# Patient Record
Sex: Female | Born: 1970 | Race: White | Hispanic: No | Marital: Single | Smoking: Former smoker
Health system: Southern US, Community
[De-identification: ages and names within clinical notes are randomized; demographics above are authoritative.]

## PROBLEM LIST (undated history)

## (undated) DIAGNOSIS — E039 Hypothyroidism, unspecified: Secondary | ICD-10-CM

## (undated) DIAGNOSIS — E2740 Unspecified adrenocortical insufficiency: Secondary | ICD-10-CM

## (undated) DIAGNOSIS — E119 Type 2 diabetes mellitus without complications: Secondary | ICD-10-CM

## (undated) DIAGNOSIS — E079 Disorder of thyroid, unspecified: Secondary | ICD-10-CM

---

## 1999-01-11 ENCOUNTER — Ambulatory Visit (HOSPITAL_BASED_OUTPATIENT_CLINIC_OR_DEPARTMENT_OTHER): Admission: RE | Admit: 1999-01-11 | Discharge: 1999-01-11 | Payer: Self-pay | Admitting: Specialist

## 2010-03-31 ENCOUNTER — Encounter: Admission: RE | Admit: 2010-03-31 | Discharge: 2010-03-31 | Payer: Self-pay | Admitting: Emergency Medicine

## 2010-04-21 ENCOUNTER — Encounter: Admission: RE | Admit: 2010-04-21 | Discharge: 2010-04-21 | Payer: Self-pay | Admitting: General Surgery

## 2010-04-21 ENCOUNTER — Other Ambulatory Visit: Admission: RE | Admit: 2010-04-21 | Discharge: 2010-04-21 | Payer: Self-pay | Admitting: Interventional Radiology

## 2010-06-24 ENCOUNTER — Ambulatory Visit (HOSPITAL_COMMUNITY): Admission: RE | Admit: 2010-06-24 | Discharge: 2010-06-25 | Payer: Self-pay | Admitting: Surgery

## 2010-06-24 ENCOUNTER — Encounter (INDEPENDENT_AMBULATORY_CARE_PROVIDER_SITE_OTHER): Payer: Self-pay | Admitting: Surgery

## 2010-10-17 ENCOUNTER — Encounter: Payer: Self-pay | Admitting: General Surgery

## 2010-12-09 LAB — BASIC METABOLIC PANEL
Calcium: 9.2 mg/dL (ref 8.4–10.5)
GFR calc Af Amer: >60 mL/min (ref >60)
Potassium: 4.2 mEq/L (ref 3.5–5.1)

## 2010-12-09 LAB — SURGICAL PCR SCREEN: MRSA, PCR: NEGATIVE

## 2010-12-09 LAB — DIFFERENTIAL
Basophils Relative: 0 % (ref 0–1)
Eosinophils Absolute: 0 10*3/uL (ref 0.0–0.7)
Lymphocytes Relative: 20 % (ref 12–46)
Monocytes Absolute: 0.5 10*3/uL (ref 0.1–1.0)
Monocytes Relative: 5 % (ref 3–12)
Neutro Abs: 6.8 10*3/uL (ref 1.7–7.7)
Neutrophils Relative %: 75 % (ref 43–77)

## 2010-12-09 LAB — URINALYSIS, ROUTINE W REFLEX MICROSCOPIC
Glucose, UA: NEGATIVE mg/dL
Hgb urine dipstick: NEGATIVE
Ketones, ur: NEGATIVE mg/dL
Urobilinogen, UA: 1 mg/dL (ref 0.0–1.0)
pH: 6 (ref 5.0–8.0)

## 2010-12-09 LAB — PREGNANCY, URINE: Preg Test, Ur: NEGATIVE

## 2010-12-09 LAB — PROTIME-INR
INR: 0.92 (ref 0.00–1.49)
Prothrombin Time: 12.6 seconds (ref 11.6–15.2)

## 2010-12-09 LAB — CBC: RBC: 4.34 MIL/uL (ref 3.87–5.11)

## 2012-01-06 ENCOUNTER — Other Ambulatory Visit: Payer: Self-pay | Admitting: Family Medicine

## 2012-01-06 DIAGNOSIS — N63 Unspecified lump in unspecified breast: Secondary | ICD-10-CM

## 2012-01-13 ENCOUNTER — Ambulatory Visit
Admission: RE | Admit: 2012-01-13 | Discharge: 2012-01-13 | Disposition: A | Discharge status: Home / Self Care | Payer: BC Managed Care – PPO | Source: Ambulatory Visit | Attending: Family Medicine | Admitting: Family Medicine

## 2012-01-13 DIAGNOSIS — N63 Unspecified lump in unspecified breast: Secondary | ICD-10-CM

## 2013-10-12 ENCOUNTER — Emergency Department (HOSPITAL_COMMUNITY)
Admission: EM | Admit: 2013-10-12 | Discharge: 2013-10-12 | Disposition: A | Discharge status: Home / Self Care | Payer: BC Managed Care – PPO | Attending: Emergency Medicine | Admitting: Emergency Medicine

## 2013-10-12 ENCOUNTER — Encounter (HOSPITAL_COMMUNITY): Payer: Self-pay | Admitting: Emergency Medicine

## 2013-10-12 ENCOUNTER — Emergency Department (HOSPITAL_COMMUNITY): Payer: BC Managed Care – PPO

## 2013-10-12 ENCOUNTER — Other Ambulatory Visit: Payer: Self-pay

## 2013-10-12 DIAGNOSIS — Z862 Personal history of diseases of the blood and blood-forming organs and certain disorders involving the immune mechanism: Secondary | ICD-10-CM | POA: Insufficient documentation

## 2013-10-12 DIAGNOSIS — Z880 Allergy status to penicillin: Secondary | ICD-10-CM | POA: Insufficient documentation

## 2013-10-12 DIAGNOSIS — R0602 Shortness of breath: Secondary | ICD-10-CM | POA: Insufficient documentation

## 2013-10-12 DIAGNOSIS — R079 Chest pain, unspecified: Secondary | ICD-10-CM

## 2013-10-12 DIAGNOSIS — Z8639 Personal history of other endocrine, nutritional and metabolic disease: Secondary | ICD-10-CM | POA: Insufficient documentation

## 2013-10-12 DIAGNOSIS — F17200 Nicotine dependence, unspecified, uncomplicated: Secondary | ICD-10-CM | POA: Insufficient documentation

## 2013-10-12 LAB — CBC WITH DIFFERENTIAL/PLATELET
BASOS PCT: 0 % (ref 0–1)
Basophils Absolute: 0 10*3/uL (ref 0.0–0.1)
EOS ABS: 0.2 10*3/uL (ref 0.0–0.7)
EOS PCT: 2 % (ref 0–5)
HCT: 38.6 % (ref 36.0–46.0)
Hemoglobin: 12.6 g/dL (ref 12.0–15.0)
LYMPHS ABS: 2 10*3/uL (ref 0.7–4.0)
LYMPHS PCT: 20 % (ref 12–46)
MCH: 26.6 pg (ref 26.0–34.0)
MCHC: 32.6 g/dL (ref 30.0–36.0)
MCV: 81.6 fL (ref 78.0–100.0)
MONO ABS: 0.8 10*3/uL (ref 0.1–1.0)
Monocytes Relative: 8 % (ref 3–12)
NEUTROS PCT: 71 % (ref 43–77)
Neutro Abs: 7 10*3/uL (ref 1.7–7.7)
PLATELETS: 164 10*3/uL (ref 150–400)
RBC: 4.73 MIL/uL (ref 3.87–5.11)
RDW: 13.4 % (ref 11.5–15.5)
WBC: 9.9 10*3/uL (ref 4.0–10.5)

## 2013-10-12 LAB — BASIC METABOLIC PANEL
BUN: 9 mg/dL (ref 6–23)
CALCIUM: 8.9 mg/dL (ref 8.4–10.5)
CO2: 24 meq/L (ref 19–32)
Chloride: 102 mEq/L (ref 96–112)
Creatinine, Ser: 0.55 mg/dL (ref 0.50–1.10)
GLUCOSE: 130 mg/dL — AB (ref 70–99)
POTASSIUM: 3.8 meq/L (ref 3.7–5.3)
Sodium: 138 mEq/L (ref 137–147)

## 2013-10-12 LAB — POCT I-STAT TROPONIN I
TROPONIN I, POC: 0 ng/mL (ref 0.00–0.08)
Troponin i, poc: 0.01 ng/mL (ref 0.00–0.08)

## 2013-10-12 LAB — D-DIMER, QUANTITATIVE: D-Dimer, Quant: <0.27 ug/mL-FEU (ref 0.00–0.48)

## 2013-10-12 MED ORDER — NAPROXEN 500 MG PO TABS: 500.0000 mg | ORAL_TABLET | Freq: Two times a day (BID) | ORAL | Status: DC

## 2013-10-12 MED ORDER — NITROGLYCERIN 0.4 MG SL SUBL
0.4000 mg | SUBLINGUAL_TABLET | SUBLINGUAL | Status: AC | PRN
  Administered 2013-10-12 (×3): 0.4 mg via SUBLINGUAL
  Filled 2013-10-12: qty 25

## 2013-10-12 MED ORDER — OXYCODONE-ACETAMINOPHEN 5-325 MG PO TABS
1.0000 | ORAL_TABLET | Freq: Once | ORAL | Status: AC
  Administered 2013-10-12: 1 via ORAL
  Filled 2013-10-12: qty 1

## 2013-10-12 MED ORDER — SODIUM CHLORIDE 0.9 % IV BOLUS (SEPSIS)
1000.0000 mL | Freq: Once | INTRAVENOUS | Status: AC
  Administered 2013-10-12: 1000 mL via INTRAVENOUS

## 2013-10-12 MED ORDER — KETOROLAC TROMETHAMINE 30 MG/ML IJ SOLN
30.0000 mg | Freq: Once | INTRAMUSCULAR | Status: AC
  Administered 2013-10-12: 30 mg via INTRAVENOUS
  Filled 2013-10-12: qty 1

## 2013-10-12 MED ORDER — IBUPROFEN 800 MG PO TABS: 800.0000 mg | ORAL_TABLET | Freq: Three times a day (TID) | ORAL | Status: DC

## 2013-10-12 MED ORDER — METHOCARBAMOL 500 MG PO TABS: 500.0000 mg | ORAL_TABLET | Freq: Two times a day (BID) | ORAL | Status: DC

## 2013-10-12 MED ORDER — ASPIRIN 81 MG PO CHEW
324.0000 mg | CHEWABLE_TABLET | Freq: Once | ORAL | Status: AC
  Administered 2013-10-12: 324 mg via ORAL
  Filled 2013-10-12: qty 4

## 2013-10-12 NOTE — Discharge Instructions (Signed)
Take the prescribed medication as directed. Follow up with your primary care physician next week for re-check and to discuss this ED visit. Return to the ED for new or worsening symptoms.

## 2013-10-12 NOTE — ED Notes (Signed)
Pt states no relief with (3) sublingual nitroglycerin. Vital signs stable.

## 2013-10-12 NOTE — ED Notes (Signed)
Pt reports upper chest pains that started this am 0630, radiates into neck and down bilateral arms. Reports mild sob. ekg done on arrival. No acute distress noted.

## 2013-10-12 NOTE — ED Notes (Signed)
Any chest pain at this time

## 2013-10-12 NOTE — ED Notes (Signed)
Pt states 3/10 chest pain at the time. Vital signs stable. Remains on cardiac monitor. No signs of distress noted. Pt is alert and oriented x4.

## 2013-10-12 NOTE — ED Provider Notes (Signed)
CSN: [id number]     Arrival date & time 10/12/13  0751 History   First MD Initiated Contact with Patient 10/12/13 0756     Chief Complaint  Patient presents with  . Chest Pain   (Consider location/radiation/quality/duration/timing/severity/associated sxs/prior Treatment) Patient is a 43 y.o. female presenting with chest pain. The history is provided by the patient and medical records.  Chest Pain Associated symptoms: shortness of breath    This is a 43 year old female with past medical history significant for adrenal insufficiency is presenting to the ED for chest pain, onset 6:30 this morning. Pain is described as a crushing pressure, like an elephant is sitting on her chest. Chest some radiation of pain into the left side of her neck and some mild shortness of breath.  Symptoms worse with exertion and deep breathing.  Patient has no prior cardiac history. She does have a significant family history for cardiac disease, states her father had his first MI at 6035 but notes he did use IV drugs. Patient is a daily smoker, approx 4 cigarettes daily.  She has taken 800 mg of Motrin prior to arrival without improvement.  Pt does note that she recently had a right ear infection and did not increase her hydrocortisone, states she takes 25mg  daily.  States sx have since cleared-- no fevers, sweats, chills, cough, or other URI type sx. Unsure if current sx are related to her adrenal insufficiency.  VS stable on arrival.  History reviewed. No pertinent past medical history. History reviewed. No pertinent past surgical history. History reviewed. No pertinent family history. History  Substance Use Topics  . Smoking status: Current Every Day Smoker    Types: Cigarettes  . Smokeless tobacco: Not on file  . Alcohol Use: No   OB History   Grav Para Term Preterm Abortions TAB SAB Ect Mult Living                 Review of Systems  Respiratory: Positive for shortness of breath.   Cardiovascular: Positive  for chest pain.  All other systems reviewed and are negative.    Allergies  Ciprofloxacin and Penicillins  Home Medications  No current outpatient prescriptions on file. BP 127/71  Pulse 86  Temp(Src) 97.7 F (36.5 C) (Oral)  Resp 24  Ht 5\' 8"  (1.727 m)  Wt 245 lb (111.131 kg)  BMI 37.26 kg/m2  SpO2 96%  Physical Exam  Nursing note and vitals reviewed. Constitutional: She is oriented to person, place, and time. She appears well-developed and well-nourished. No distress.  HENT:  Head: Normocephalic and atraumatic.  Right Ear: Tympanic membrane and ear canal normal.  Left Ear: Tympanic membrane and ear canal normal.  Nose: Nose normal.  Mouth/Throat: Uvula is midline, oropharynx is clear and moist and mucous membranes are normal.  Eyes: Conjunctivae and EOM are normal. Pupils are equal, round, and reactive to light.  Neck: Normal range of motion. Neck supple.  Cardiovascular: Normal rate, regular rhythm and normal heart sounds.   Pulmonary/Chest: Effort normal and breath sounds normal. No respiratory distress. She has no wheezes.  Abdominal: Soft. Bowel sounds are normal. There is no tenderness. There is no guarding.  Musculoskeletal: Normal range of motion. She exhibits no edema.  Neurological: She is alert and oriented to person, place, and time.  Skin: Skin is warm and dry. She is not diaphoretic.  Psychiatric: She has a normal mood and affect.    ED Course  Procedures (including critical care time) Labs Review Labs  Reviewed  BASIC METABOLIC PANEL - Abnormal; Notable for the following:    Glucose, Bld 130 (*)    All other components within normal limits  CBC WITH DIFFERENTIAL  D-DIMER, QUANTITATIVE  POCT I-STAT TROPONIN I  POCT I-STAT TROPONIN I   Imaging Review Dg Chest 2 View  10/12/2013   CLINICAL DATA:  Centralized chest pain for 3 hr  EXAM: CHEST  2 VIEW  COMPARISON:  07/20/2011  FINDINGS: Limited inspiratory effect exaggerates heart size and vascular  markings. Allowing for this, heart size is upper normal to mildly enlarged and stable. No consolidation or effusion. No change from prior study.  IMPRESSION: No active cardiopulmonary disease.   Electronically Signed   By: Esperanza Heir M.D.   On: 10/12/2013 09:04    EKG Interpretation    Date/Time:  Saturday October 12 2013 07:52:40 EST Ventricular Rate:  87 PR Interval:  166 QRS Duration: 90 QT Interval:  382 QTC Calculation: 459 R Axis:   35 Text Interpretation:  Normal sinus rhythm Normal ECG No significant change since last tracing Confirmed by KNAPP  MD-I, IVA ([id number]) on 10/12/2013 8:44:46 AM            MDM   1. Chest pain    EKG normal sinus rhythm, no acute ischemic changes. Chest x-ray is clear. Initial and delta troponins negative.  Labs reassuring.  At this time i have low suspicion for ACS, PE, dissection, or other acute cardiac event, possible pleurisy given increased pain with deep breathing and movement.  Do not feel that sx today are due to adrenal crisis. Pt afebrile, non-toxic appearing, NAD, VS stable- ok for discharge. Pt will FU with her doctor on Monday for re-check and to discuss this ED visit.  Rx robaxin and naprosyn.  Discussed plan with pt, she agreed.  Return precautions advised.  Discussed with Dr. Lynelle Doctor who personally evaluated pt and agrees with assessment and plan of care.  Garlon Hatchet, PA-C 10/12/13 1505

## 2013-10-12 NOTE — ED Provider Notes (Signed)
See prior note   Ward GivensIva L Fareedah Mahler, MD 10/12/13 1558

## 2013-10-12 NOTE — ED Notes (Signed)
Refused to be discharged demanding to speak to Md,  Allyne GeeSanders NP notified and stated will go and talk to patient soon.

## 2013-10-12 NOTE — ED Provider Notes (Signed)
Patient reports when she got up at 6:30 this morning she was having some pain in her upper center chest that she states feels like "a baby elephant sitting on my chest". The patient also has a pleuritic component. She denies shortness of breath or nausea however on the way to the ED she had some mild clamminess. She also states in the vehicle when they went around curves or when she moves her arm or moves her body the pain also gets worse. She denies any coughing or fever. She states she's never had it before. The only change of activity is that she sat at a computer 40 hours trying to get paperwork done. She has mild swelling of her ankles today. She also states walking makes the pain worse. She states her father had MI at age 43 and a maternal grandfather had a CABG at age 43 and has had angioplasty done. She states she's never had this pain before.  Patient has history of adrenal insufficiency and is on hydrocortisone 25 mg a day. Patient is alert and cooperative in no distress. Her lungs are clear. Her chest wall is nontender to palpation. She indicates her upper chest in the center is where her discomfort is located.    Medical screening examination/treatment/procedure(s) were conducted as a shared visit with non-physician practitioner(s) and myself.  I personally evaluated the patient during the encounter.  EKG Interpretation    Date/Time:  Saturday October 12 2013 07:52:40 EST Ventricular Rate:  87 PR Interval:  166 QRS Duration: 90 QT Interval:  382 QTC Calculation: 459 R Axis:   35 Text Interpretation:  Normal sinus rhythm Normal ECG No significant change since last tracing Confirmed by Anastyn Ayars  MD-I, Takeysha Bonk ([id number]) on 10/12/2013 8:44:46 AM             Devoria AlbeIva Devean Skoczylas, MD, Franz DellFACEP   Kasra Melvin L Nalu Troublefield, MD 10/12/13 [id number]

## 2013-10-12 NOTE — ED Notes (Addendum)
Pt returns from xray.placed back on monitor

## 2021-07-08 ENCOUNTER — Other Ambulatory Visit: Payer: Self-pay

## 2021-07-08 ENCOUNTER — Observation Stay (HOSPITAL_COMMUNITY)
Admission: EM | Admit: 2021-07-08 | Discharge: 2021-07-09 | Disposition: A | Discharge status: Home / Self Care | Payer: BC Managed Care – PPO | Attending: Internal Medicine | Admitting: Internal Medicine

## 2021-07-08 ENCOUNTER — Encounter (HOSPITAL_COMMUNITY): Payer: Self-pay | Admitting: Emergency Medicine

## 2021-07-08 DIAGNOSIS — Z28310 Unvaccinated for covid-19: Secondary | ICD-10-CM | POA: Diagnosis not present

## 2021-07-08 DIAGNOSIS — E119 Type 2 diabetes mellitus without complications: Secondary | ICD-10-CM

## 2021-07-08 DIAGNOSIS — E039 Hypothyroidism, unspecified: Principal | ICD-10-CM | POA: Insufficient documentation

## 2021-07-08 DIAGNOSIS — F17210 Nicotine dependence, cigarettes, uncomplicated: Secondary | ICD-10-CM | POA: Insufficient documentation

## 2021-07-08 DIAGNOSIS — Z79899 Other long term (current) drug therapy: Secondary | ICD-10-CM | POA: Diagnosis not present

## 2021-07-08 DIAGNOSIS — R42 Dizziness and giddiness: Secondary | ICD-10-CM | POA: Diagnosis present

## 2021-07-08 DIAGNOSIS — Z20822 Contact with and (suspected) exposure to covid-19: Secondary | ICD-10-CM | POA: Insufficient documentation

## 2021-07-08 DIAGNOSIS — R030 Elevated blood-pressure reading, without diagnosis of hypertension: Secondary | ICD-10-CM

## 2021-07-08 DIAGNOSIS — E2740 Unspecified adrenocortical insufficiency: Secondary | ICD-10-CM | POA: Insufficient documentation

## 2021-07-08 DIAGNOSIS — N179 Acute kidney failure, unspecified: Secondary | ICD-10-CM | POA: Diagnosis not present

## 2021-07-08 LAB — COMPREHENSIVE METABOLIC PANEL
ALT: 34 U/L (ref 0–44)
AST: 43 U/L — ABNORMAL HIGH (ref 15–41)
Albumin: 4.2 g/dL (ref 3.5–5.0)
Alkaline Phosphatase: 67 U/L (ref 38–126)
Anion gap: 7 (ref 5–15)
BUN: 13 mg/dL (ref 6–20)
CO2: 32 mmol/L (ref 22–32)
Calcium: 8.4 mg/dL — ABNORMAL LOW (ref 8.9–10.3)
Chloride: 97 mmol/L — ABNORMAL LOW (ref 98–111)
Creatinine, Ser: 1.11 mg/dL — ABNORMAL HIGH (ref 0.44–1.00)
GFR, Estimated: >60 mL/min (ref >60)
Glucose, Bld: 141 mg/dL — ABNORMAL HIGH (ref 70–99)
Potassium: 3.6 mmol/L (ref 3.5–5.1)
Sodium: 136 mmol/L (ref 135–145)
Total Bilirubin: 0.7 mg/dL (ref 0.3–1.2)
Total Protein: 7.6 g/dL (ref 6.5–8.1)

## 2021-07-08 LAB — CBC WITH DIFFERENTIAL/PLATELET
Abs Immature Granulocytes: 0.02 10*3/uL (ref 0.00–0.07)
Basophils Absolute: 0.1 10*3/uL (ref 0.0–0.1)
Basophils Relative: 1 %
Eosinophils Absolute: 0.1 10*3/uL (ref 0.0–0.5)
Eosinophils Relative: 2 %
HCT: 44.9 % (ref 36.0–46.0)
Hemoglobin: 14.3 g/dL (ref 12.0–15.0)
Immature Granulocytes: 0 %
Lymphocytes Relative: 36 %
Lymphs Abs: 3 10*3/uL (ref 0.7–4.0)
MCH: 27.8 pg (ref 26.0–34.0)
MCHC: 31.8 g/dL (ref 30.0–36.0)
MCV: 87.2 fL (ref 80.0–100.0)
Monocytes Absolute: 0.5 10*3/uL (ref 0.1–1.0)
Monocytes Relative: 6 %
Neutro Abs: 4.6 10*3/uL (ref 1.7–7.7)
Neutrophils Relative %: 55 %
Platelets: 184 10*3/uL (ref 150–400)
RBC: 5.15 MIL/uL — ABNORMAL HIGH (ref 3.87–5.11)
RDW: 16.2 % — ABNORMAL HIGH (ref 11.5–15.5)
WBC: 8.3 10*3/uL (ref 4.0–10.5)
nRBC: 0 % (ref 0.0–0.2)

## 2021-07-08 LAB — CK: Total CK: 854 U/L — ABNORMAL HIGH (ref 38–234)

## 2021-07-08 LAB — BRAIN NATRIURETIC PEPTIDE: B Natriuretic Peptide: 7.1 pg/mL (ref 0.0–100.0)

## 2021-07-08 LAB — CORTISOL: Cortisol, Plasma: 3 ug/dL

## 2021-07-08 LAB — RESP PANEL BY RT-PCR (FLU A&B, COVID) ARPGX2
Influenza A by PCR: NEGATIVE
Influenza B by PCR: NEGATIVE
SARS Coronavirus 2 by RT PCR: NEGATIVE

## 2021-07-08 LAB — SODIUM, URINE, RANDOM: Sodium, Ur: 50 mmol/L

## 2021-07-08 LAB — CREATININE, URINE, RANDOM: Creatinine, Urine: 96.94 mg/dL

## 2021-07-08 LAB — T4, FREE: Free T4: <0.25 ng/dL — ABNORMAL LOW (ref 0.61–1.12)

## 2021-07-08 LAB — TSH: TSH: 45.494 u[IU]/mL — ABNORMAL HIGH (ref 0.350–4.500)

## 2021-07-08 MED ORDER — INSULIN ASPART 100 UNIT/ML IJ SOLN
0.0000 [IU] | INTRAMUSCULAR | Status: DC
  Administered 2021-07-09: 3 [IU] via SUBCUTANEOUS
  Administered 2021-07-09: 2 [IU] via SUBCUTANEOUS
  Administered 2021-07-09: 3 [IU] via SUBCUTANEOUS

## 2021-07-08 MED ORDER — ACETAMINOPHEN 325 MG PO TABS: 650.0000 mg | ORAL_TABLET | Freq: Four times a day (QID) | ORAL | Status: DC | PRN

## 2021-07-08 MED ORDER — HYDROCODONE-ACETAMINOPHEN 5-325 MG PO TABS: 1.0000 | ORAL_TABLET | ORAL | Status: DC | PRN

## 2021-07-08 MED ORDER — SODIUM CHLORIDE 0.9 % IV SOLN: INTRAVENOUS | Status: DC

## 2021-07-08 MED ORDER — ACETAMINOPHEN 650 MG RE SUPP: 650.0000 mg | Freq: Four times a day (QID) | RECTAL | Status: DC | PRN

## 2021-07-08 MED ORDER — LIOTHYRONINE SODIUM 5 MCG PO TABS
2.5000 ug | ORAL_TABLET | Freq: Two times a day (BID) | ORAL | Status: DC
  Administered 2021-07-08: 2.5 ug via ORAL
  Filled 2021-07-08 (×2): qty 1

## 2021-07-08 MED ORDER — HYDROCORTISONE SOD SUC (PF) 100 MG IJ SOLR
100.0000 mg | Freq: Once | INTRAMUSCULAR | Status: AC
  Administered 2021-07-08: 100 mg via INTRAVENOUS
  Filled 2021-07-08: qty 2

## 2021-07-08 MED ORDER — HYDROCORTISONE SOD SUC (PF) 100 MG IJ SOLR
100.0000 mg | Freq: Three times a day (TID) | INTRAMUSCULAR | Status: DC
  Administered 2021-07-09: 100 mg via INTRAVENOUS
  Filled 2021-07-08: qty 2

## 2021-07-08 NOTE — ED Provider Notes (Signed)
[facility]-EMERGENCY DEPT Provider Note   CSN: [id number] Arrival date & time: 07/08/21  1244     History Chief Complaint  Patient presents with   Altered Mental Status   Dizziness   Numbness    Natalie Mills is a 50 y.o. female who presents with worsening brain fog and confusion as well as fluid retention.  Patient states that over the last week and a half she has noticed that she is retaining more fluid than normal and that she is more slowed both physically and mentally.  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  Her wife who was in the room states that at baseline the patient speaks quickly however recently she has had increasing he long pauses in her thoughts.  She denies changes in bowel habits but does note trying for skin, hair, and nails.  She does have some mild abdominal pain.  She notes that she has occasional palpitations and has noted that her heart rate to be lower than normal (she states it has normally been in the 90s).  About 6 weeks ago the patient switched to an over-the-counter bovine thyroid supplement and she was initially feeling well with increased energy.  Three weeks ago she rolled her ankle and has not had full resolution of pain symptoms from this.  She feels that since rolling her ankle she has noticed a decline in how she feels overall.  She feels fatigued and weak.  Patient had a thyroidectomy in 2011.  She also reports chronic adrenal insufficiency however is not taking anything for that at this time.  She states that her most recent TSH level was collected about a year and a half ago and was less than 1.  The physician that manages her thyroid medication is not aware of her recent medication change according to the patient.  She states that she has tried Synthroid in the past and it does not work however Cytomel has been effective  historically.     History reviewed. No pertinent past medical history.  There are no problems to display for this patient.   History reviewed. No pertinent surgical history.   OB History   No obstetric history on file.     No family history on file.  Social History   Tobacco Use   Smoking status: Every Day    Types: Cigarettes  Substance Use Topics   Alcohol use: No   Drug use: No    Home Medications Prior to Admission medications   Medication Sig Start Date End Date Taking? Authorizing Provider  hydrocortisone (CORTEF) 5 MG tablet Take 5-10 mg by mouth 3 (three) times daily. 10 mg at 0630, 10 mg at 1230, 5 mg at 1830    [provider]  ibuprofen (ADVIL,MOTRIN) 200 MG tablet Take 800 mg by mouth every 6 (six) hours as needed for fever, headache or mild pain.    [provider]  liothyronine (CYTOMEL) 25 MCG tablet Take 25-75 mcg by mouth 3 (three) times daily. Takes 2 tabs at @ 0630, 2 Tabs @ 1230 and 3 tabs at 1830    [provider]  methocarbamol (ROBAXIN) 500 MG tablet Take 1 tablet (500 mg total) by mouth 2 (two) times daily. 10/12/13   Garlon Hatchet, PA-C  naproxen (NAPROSYN) 500 MG tablet Take 1 tablet (500 mg total) by mouth 2 (two) times  daily with a meal. 10/12/13   Garlon Hatchet, PA-C  progesterone (PROMETRIUM) 100 MG capsule Take 300 mg by mouth daily. Takes on days 17-26 menstrual cycle    [provider]    Allergies    Ciprofloxacin, Penicillins, and Prednisone  Review of Systems   Review of Systems  Constitutional:  Positive for chills, diaphoresis and fatigue. Negative for activity change, appetite change and fever.  Respiratory:  Negative for shortness of breath.   Cardiovascular:  Positive for palpitations and leg swelling. Negative for chest pain.  Gastrointestinal:  Positive for abdominal pain. Negative for abdominal distention, constipation, diarrhea and vomiting.  Endocrine: Positive for cold intolerance.  Negative for polydipsia and polyuria.  Neurological:  Positive for dizziness, speech difficulty and weakness.  Psychiatric/Behavioral:  Positive for confusion and decreased concentration.    Physical Exam Updated Vital Signs BP (!) 145/95   Pulse 63   Temp 98.4 F (36.9 C) (Oral)   Resp 18   SpO2 98%   Constitutional: Patient is lying calmly on the bed.  No acute distress noted. Eyes: Negative for exophthalmos. Cardio: Regular rate and rhythm.  No murmurs, rubs, gallops. Pulm: Clear to auscultation bilaterally.  Normal work of breathing on room air. Abdomen: Soft, nondistended but with mild tenderness to palpation diffusely. MSK: Bilateral nonpitting lower extremity edema. Skin: Skin is warm and dry.  No flaking skin noted. Neuro: Alert and oriented x3.  No focal deficit noted.  Patient was able to adequately answer all questions without speech delay. Psych: Patient appears to be anxious.  ED Results / Procedures / Treatments   Labs (all labs ordered are listed, but only abnormal results are displayed) Labs Reviewed  COMPREHENSIVE METABOLIC PANEL - Abnormal; Notable for the following components:      Result Value   Chloride 97 (*)    Glucose, Bld 141 (*)    Creatinine, Ser 1.11 (*)    Calcium 8.4 (*)    AST 43 (*)    All other components within normal limits  CBC WITH DIFFERENTIAL/PLATELET - Abnormal; Notable for the following components:   RBC 5.15 (*)    RDW 16.2 (*)    All other components within normal limits  TSH - Abnormal; Notable for the following components:   TSH 45.494 (*)    All other components within normal limits  RESP PANEL BY RT-PCR (FLU A&B, COVID) ARPGX2  BRAIN NATRIURETIC PEPTIDE  T4, FREE  T3    EKG None  Radiology No results found.  Procedures None  Medications Ordered in ED Medications  hydrocortisone sodium succinate (SOLU-CORTEF) 100 MG injection 100 mg (has no administration in time range)  liothyronine (CYTOMEL) tablet 2.5 mcg  (has no administration in time range)    ED Course  I have reviewed the triage vital signs and the nursing notes.  Pertinent labs & imaging results that were available during my care of the patient were reviewed by me and considered in my medical decision making (see chart for details).    MDM Rules/Calculators/A&P                           Patient presents with gradual confusion and labile particularly over the last week and notes that she has had increased weakness and slowed healing from an ankle injury 3 weeks ago.  She underwent thyroidectomy in 2011 and also reports chronic adrenal insufficiency for which she was taking hydrocortisone prior to the COVID-19 pandemic  however at this time she is no longer taking this.  She does state that 6 weeks ago she transitioned to an over-the-counter bovine thyroid supplement at which time she was initially having increased energy however acknowledges that she is started feeling more poorly.  Initial TSH on presentation was 45.494.  T4 and T3 are pending.  Fortunately patient does not have hyponatremia, hypoglycemia, hemodynamic instability at this time lowering the concern for myxedema coma.  After speaking with pharmacy she has been initiated on Cytomel 2.5 mcg twice daily.  She is also initiated on hydrocortisone 100 mg IV once at this time.  Consult to hospitalist for admission placed at this time. . Final Clinical Impression(s) / ED Diagnoses Final diagnoses:  Hypothyroidism, unspecified type    Rx / DC Orders ED Discharge Orders     None        Champ Mungo, DO 07/08/21 1858    Charlynne Pander, MD 07/08/21 2337

## 2021-07-08 NOTE — ED Triage Notes (Signed)
Patient here from work reporting sudden onset of confusion and facial numbness that happened this morning. Hx of DM, and enlarged heart. States that she is now better but worries about weight gain due to water retention.

## 2021-07-08 NOTE — ED Provider Notes (Signed)
Emergency Medicine Provider Triage Evaluation Note  Natalie Mills , a 50 y.o. female  was evaluated in triage.  Pt complains of onset of confusion this morning at work and had onset of facial numbness. She states that she has put on 25 pounds of water weight in the last week. She has ah hx of DM, cardiomegaly, and has had her thyroid removed. .  Review of Systems  Positive: Facial numbness Negative: fever  Physical Exam  BP (!) 148/88 (BP Location: Right Arm)   Pulse 74   Temp 98.4 F (36.9 C) (Oral)   Resp 18   SpO2 97%  Gen:   Awake, no distress   Resp:  Normal effort  MSK:   Moves extremities without difficulty  Other:  No facial asymmetry  Medical Decision Making  Medically screening exam initiated at 12:59 PM.  Appropriate orders placed.  Yamilee Harmes was informed that the remainder of the evaluation will be completed by another provider, this initial triage assessment does not replace that evaluation, and the importance of remaining in the ED until their evaluation is complete.  Mutliple concerns- chiefly patient is worried that she is hypothyroid.   Arthor Captain, PA-C 07/08/21 1303    Gwyneth Sprout, MD 07/08/21 1651

## 2021-07-08 NOTE — H&P (Signed)
Natalie Mills HDQ:[id number] DOB: 1971-03-13 DOA: 07/08/2021   PCP: Jones Bales, MD   Outpatient Specialists:    [facility] in [place]  Patient arrived to ER on 07/08/21 at 1244 Referred by Attending Charlynne Pander, MD   Patient coming from: home Lives  With family    Chief Complaint:  Chief Complaint  Patient presents with   Altered Mental Status   Dizziness   Numbness    HPI: Natalie Mills is a 50 y.o. female with medical history significant of thyroidectomy in 2011.  chronic adrenal insufficiency   Presented with  fluid retention and confusion  She is not taking any medications  that she has tried Synthroid in the past and it does not work however Cytomel has been effective historically. Says manufactures stopped making it, she tried naturethyroid but it was recalled She has tried to change her meds to bovine thyroid OTC 6 wks ago  She ws taken cortef 40 mg for years she could not get it during covid, she switched to Prednisone, but that caused her DM so her MD told her to taper it off been not taking it for over a year. Patient states that she has numerous times failed ACTH stimulation test on 8   The confusion has gotten severe today  Deos not smoke quit 7 wks ago  Occasional EtOh  Has  NOt been vaccinated against COVID   Because of adrenal insufficiency   Initial COVID TEST  in house  PCR testing  Pending  No results found for: SARSCOV2NAA   Regarding pertinent Chronic problems:        Hypothyroidism:  Lab Results  Component Value Date   TSH 45.494 (H) 07/08/2021   on bovine thyroid OTC    obesity-   BMI Readings from Last 1 Encounters:  10/12/13 37.25 kg/m      While in ER: TSH >45  Started on Cytomel and hydrocortisone    ED Triage Vitals  Enc Vitals Group     BP 07/08/21 1249 (!) 148/88     Pulse Rate 07/08/21 1249 74     Resp 07/08/21 1249 18     Temp 07/08/21 1249 98.4 F (36.9 C)     Temp  Source 07/08/21 1249 Oral     SpO2 07/08/21 1249 97 %     Weight --      Height --      Head Circumference --      Peak Flow --      Pain Score 07/08/21 1330 3     Pain Loc --      Pain Edu? --      Excl. in GC? --   TMAX(24)@     _________________________________________ Significant initial  Findings: Abnormal Labs Reviewed  COMPREHENSIVE METABOLIC PANEL - Abnormal; Notable for the following components:      Result Value   Chloride 97 (*)    Glucose, Bld 141 (*)    Creatinine, Ser 1.11 (*)    Calcium 8.4 (*)    AST 43 (*)    All other components within normal limits  CBC WITH DIFFERENTIAL/PLATELET - Abnormal; Notable for the following components:   RBC 5.15 (*)    RDW 16.2 (*)    All other components within normal limits  TSH - Abnormal; Notable for the following components:   TSH 45.494 (*)    All other components within normal limits    ECG: Ordered   The recent  clinical data is shown below. Vitals:   07/08/21 1249 07/08/21 1651 07/08/21 1827  BP: (!) 148/88 (!) 140/95 (!) 145/95  Pulse: 74 64 63  Resp: 18 18 18   Temp: 98.4 F (36.9 C)    TempSrc: Oral    SpO2: 97% 98% 98%    WBC     Component Value Date/Time   WBC 8.3 07/08/2021 1417   LYMPHSABS 3.0 07/08/2021 1417   MONOABS 0.5 07/08/2021 1417   EOSABS 0.1 07/08/2021 1417   BASOSABS 0.1 07/08/2021 1417       Results for orders placed or performed during the hospital encounter of 06/24/10  Surgical pcr screen     Status: None   Collection Time: 06/21/10 12:23 PM  Result Value Ref Range Status   MRSA, PCR NEGATIVE NEGATIVE Final   Staphylococcus aureus  NEGATIVE Final    NEGATIVE        The Xpert SA Assay (FDA approved for NASAL specimens only), is one component of a comprehensive surveillance program.  It is not intended to diagnose infection nor to guide or monitor treatment.      _______________________________________________ Hospitalist was called for admission for severe hypothyroidism  and possible adrenal insufficiency  The following Work up has been ordered so far:  Orders Placed This Encounter  Procedures   Resp Panel by RT-PCR (Flu A&B, Covid) Nasopharyngeal Swab   Comprehensive metabolic panel   Brain natriuretic peptide   CBC with Differential   TSH   T4, free   T3   Consult to hospitalist   ED EKG    Following Medications were ordered in ER: Medications  hydrocortisone sodium succinate (SOLU-CORTEF) 100 MG injection 100 mg (has no administration in time range)  liothyronine (CYTOMEL) tablet 2.5 mcg (has no administration in time range)        Consult Orders  (From admission, onward)           Start     Ordered   07/08/21 1848  Consult to hospitalist  Once       Provider:  (Not yet assigned)  Question Answer Comment  Place call to: [facility]   Reason for Consult Admit      07/08/21 1847              OTHER Significant initial  Findings:  labs showing:    Recent Labs  Lab 07/08/21 1417  NA 136  K 3.6  CO2 32  GLUCOSE 141*  BUN 13  CREATININE 1.11*  CALCIUM 8.4*    Cr   Up from baseline see below Lab Results  Component Value Date   CREATININE 1.11 (H) 07/08/2021   CREATININE 0.55 10/12/2013   CREATININE 1.02 06/21/2010    Recent Labs  Lab 07/08/21 1417  AST 43*  ALT 34  ALKPHOS 67  BILITOT 0.7  PROT 7.6  ALBUMIN 4.2   Lab Results  Component Value Date   CALCIUM 8.4 (L) 07/08/2021          Plt: Lab Results  Component Value Date   PLT 184 07/08/2021       Recent Labs  Lab 07/08/21 1417  WBC 8.3  NEUTROABS 4.6  HGB 14.3  HCT 44.9  MCV 87.2  PLT 184    HG/HCT  stable,       Component Value Date/Time   HGB 14.3 07/08/2021 1417   HCT 44.9 07/08/2021 1417   MCV 87.2 07/08/2021 1417       BNP (last 3 results)  Recent Labs    07/08/21 1417  BNP 7.1      Radiological Exams on Admission: No results  found. _______________________________________________________________________________________________________ Latest   Blood pressure (!) 145/95, pulse 63, temperature 98.4 F (36.9 C), temperature source Oral, resp. rate 18, SpO2 98 %.   Review of Systems:    Pertinent positives include:   fatigue chills,, abdominal pain,   Constitutional:  No weight loss, night sweats, Fevers,  weight loss  HEENT:  No headaches, Difficulty swallowing,Tooth/dental problems,Sore throat,  No sneezing, itching, ear ache, nasal congestion, post nasal drip,  Cardio-vascular:  No chest pain, Orthopnea, PND, anasarca, dizziness, palpitations.no Bilateral lower extremity swelling  GI:  No heartburn, indigestion,nausea, vomiting, diarrhea, change in bowel habits, loss of appetite, melena, blood in stool, hematemesis Resp:  no shortness of breath at rest. No dyspnea on exertion, No excess mucus, no productive cough, No non-productive cough, No coughing up of blood.No change in color of mucus.No wheezing. Skin:  no rash or lesions. No jaundice GU:  no dysuria, change in color of urine, no urgency or frequency. No straining to urinate.  No flank pain.  Musculoskeletal:  No joint pain or no joint swelling. No decreased range of motion. No back pain.  Psych:  No change in mood or affect. No depression or anxiety. No memory loss.  Neuro: no localizing neurological complaints, no tingling, no weakness, no double vision, no gait abnormality, no slurred speech, no confusion  All systems reviewed and apart from HOPI all are negative _______________________________________________________________________________________________ Past Medical History:  History reviewed. No pertinent past medical history.    History reviewed. No pertinent surgical history.  Social History:  Ambulatory   independently      reports that she has been smoking cigarettes. She does not have any smokeless tobacco history on file.  She reports that she does not drink alcohol and does not use drugs.    Family History:   Family History  Problem Relation Age of Onset   Hypertension Other    Diabetes Other    ______________________________________________________________________________________________ Allergies: Allergies  Allergen Reactions   Ciprofloxacin Nausea And Vomiting   Penicillins Hives   Prednisone     Makes my nerves hurt     Prior to Admission medications   Medication Sig Start Date End Date Taking? Authorizing Provider  hydrocortisone (CORTEF) 5 MG tablet Take 5-10 mg by mouth 3 (three) times daily. 10 mg at 0630, 10 mg at 1230, 5 mg at 1830    [provider]  ibuprofen (ADVIL,MOTRIN) 200 MG tablet Take 800 mg by mouth every 6 (six) hours as needed for fever, headache or mild pain.    [provider]  liothyronine (CYTOMEL) 25 MCG tablet Take 25-75 mcg by mouth 3 (three) times daily. Takes 2 tabs at @ 0630, 2 Tabs @ 1230 and 3 tabs at 1830    [provider]  methocarbamol (ROBAXIN) 500 MG tablet Take 1 tablet (500 mg total) by mouth 2 (two) times daily. 10/12/13   Garlon Hatchet, PA-C  naproxen (NAPROSYN) 500 MG tablet Take 1 tablet (500 mg total) by mouth 2 (two) times daily with a meal. 10/12/13   Garlon Hatchet, PA-C  progesterone (PROMETRIUM) 100 MG capsule Take 300 mg by mouth daily. Takes on days 17-26 menstrual cycle    [provider]    ___________________________________________________________________________________________________ Physical Exam: Vitals with BMI 07/08/2021 07/08/2021 07/08/2021  Height - - -  Weight - - -  BMI - - -  Systolic 145 140 694  Diastolic 95 95 88  Pulse 63 64 74     1. General:  in No  Acute distress   Chronically ill   -appearing 2. Psychological: Alert and   Oriented 3. Head/ENT:    Dry Mucous Membranes                          Head Non traumatic, neck supple                          Normal   Dentition 4.  SKIN: decreased Skin turgor,  Skin clean Dry and intact no rash 5. Heart: Regular rate and rhythm no  Murmur, no Rub or gallop 6. Lungs:  Clear to auscultation bilaterally, no wheezes or crackles   7. Abdomen: Soft,  non-tender, Non distended    obese  bowel sounds present 8. Lower extremities: no clubbing, cyanosis, no  edema 9. Neurologically Grossly intact, moving all 4 extremities equally   10. MSK: Normal range of motion    Chart has been reviewed  ______________________________________________________________________________________________  Assessment/Plan   50 y.o. female with medical history significant of thyroidectomy in 2011.  chronic adrenal insufficiency  Admitted for severe hypothyroidism and possible adrenal insufficiency with AKI  Present on Admission:  Hypothyroidism restart Cytomel, await result of t4 t3 will need establish care with endocrinology    Elevated BP without diagnosis of hypertension will need to be addressed further as an outpatient unless becomes severe.  For tonight allow permissive hypertension   Adrenal insufficiency (HCC) -for now restart hydrocortisone   AKI (acute kidney injury) (HCC) -obtain electrolytes rehydrate follow renal status  Dm2 -  - Order Sensitive  SSI    -  check  HgA1C     Other plan as per orders.  DVT prophylaxis:   Lovenox       Code Status:    Code Status: Not on file FULL CODE  as per patient  I had personally discussed CODE STATUS with patient      Family Communication:   Family  at  Bedside  plan of care was discussed  with    Wife   Disposition Plan:       To home once workup is complete and patient is stable   Following barriers for discharge:                            Electrolytes corrected                                        Consults called: none will need refferal for endocrinology  Admission status:  ED Disposition     ED Disposition  Admit   Condition  --   Comment  Hospital Area: [facility] [[id number]]  Level of Care: Progressive [102]  Admit to Progressive based on following criteria: CARDIOVASCULAR & THORACIC of moderate stability with acute coronary syndrome symptoms/low risk myocardial infarction/hypertensive urgency/arrhythmias/heart failure potentially compromising stability and stable post cardiovascular intervention patients.  May place patient in observation at [facility] or [facility] if equivalent level of care is available:: No  Covid Evaluation: Asymptomatic Screening Protocol (No Symptoms)  Diagnosis: Hypothyroidism [854627]  Admitting Physician: Therisa Doyne [[id number]]  Attending Physician: Therisa Doyne [[id number]]  Obs     Level of care   progressive    No results found for: SARSCOV2NAA   Precautions: admitted as asymptomatic screening protocol    PPE: Used by the provider:   N95  eye Goggles,  Gloves      Natalie Mills 07/08/2021, 9:28 PM    [facility]     after 2 AM please page floor coverage PA If 7AM-7PM, please contact the day team taking care of the patient using [email or web address]   Patient was evaluated in the context of the global COVID-19 pandemic, which necessitated consideration that the patient might be at risk for infection with the SARS-CoV-2 virus that causes COVID-19. Institutional protocols and algorithms that pertain to the evaluation of patients at risk for COVID-19 are in a state of rapid change based on information released by regulatory bodies including the CDC and federal and state organizations. These policies and algorithms were followed during the patient's care.

## 2021-07-09 DIAGNOSIS — E2740 Unspecified adrenocortical insufficiency: Secondary | ICD-10-CM | POA: Diagnosis not present

## 2021-07-09 DIAGNOSIS — E039 Hypothyroidism, unspecified: Secondary | ICD-10-CM | POA: Diagnosis not present

## 2021-07-09 DIAGNOSIS — N179 Acute kidney failure, unspecified: Secondary | ICD-10-CM | POA: Diagnosis not present

## 2021-07-09 DIAGNOSIS — E119 Type 2 diabetes mellitus without complications: Secondary | ICD-10-CM | POA: Diagnosis not present

## 2021-07-09 LAB — GLUCOSE, CAPILLARY
Glucose-Capillary: 125 mg/dL — ABNORMAL HIGH (ref 70–99)
Glucose-Capillary: 143 mg/dL — ABNORMAL HIGH (ref 70–99)
Glucose-Capillary: 152 mg/dL — ABNORMAL HIGH (ref 70–99)
Glucose-Capillary: 178 mg/dL — ABNORMAL HIGH (ref 70–99)

## 2021-07-09 LAB — CBC WITH DIFFERENTIAL/PLATELET
Abs Immature Granulocytes: 0.03 10*3/uL (ref 0.00–0.07)
Basophils Absolute: 0 10*3/uL (ref 0.0–0.1)
Basophils Relative: 0 %
Eosinophils Absolute: 0 10*3/uL (ref 0.0–0.5)
Eosinophils Relative: 0 %
HCT: 41.3 % (ref 36.0–46.0)
Hemoglobin: 13.5 g/dL (ref 12.0–15.0)
Immature Granulocytes: 0 %
Lymphocytes Relative: 16 %
Lymphs Abs: 1.3 10*3/uL (ref 0.7–4.0)
MCH: 27.8 pg (ref 26.0–34.0)
MCHC: 32.7 g/dL (ref 30.0–36.0)
MCV: 85 fL (ref 80.0–100.0)
Monocytes Absolute: 0.1 10*3/uL (ref 0.1–1.0)
Monocytes Relative: 1 %
Neutro Abs: 6.3 10*3/uL (ref 1.7–7.7)
Neutrophils Relative %: 83 %
Platelets: 177 10*3/uL (ref 150–400)
RBC: 4.86 MIL/uL (ref 3.87–5.11)
RDW: 16 % — ABNORMAL HIGH (ref 11.5–15.5)
WBC: 7.7 10*3/uL (ref 4.0–10.5)
nRBC: 0 % (ref 0.0–0.2)

## 2021-07-09 LAB — TSH: TSH: 21.759 u[IU]/mL — ABNORMAL HIGH (ref 0.350–4.500)

## 2021-07-09 LAB — COMPREHENSIVE METABOLIC PANEL
ALT: 32 U/L (ref 0–44)
AST: 53 U/L — ABNORMAL HIGH (ref 15–41)
Albumin: 3.7 g/dL (ref 3.5–5.0)
Alkaline Phosphatase: 59 U/L (ref 38–126)
Anion gap: 9 (ref 5–15)
BUN: 13 mg/dL (ref 6–20)
CO2: 27 mmol/L (ref 22–32)
Calcium: 8.6 mg/dL — ABNORMAL LOW (ref 8.9–10.3)
Chloride: 99 mmol/L (ref 98–111)
Creatinine, Ser: 0.92 mg/dL (ref 0.44–1.00)
GFR, Estimated: >60 mL/min (ref >60)
Glucose, Bld: 154 mg/dL — ABNORMAL HIGH (ref 70–99)
Potassium: 4.3 mmol/L (ref 3.5–5.1)
Sodium: 135 mmol/L (ref 135–145)
Total Bilirubin: 0.9 mg/dL (ref 0.3–1.2)
Total Protein: 7 g/dL (ref 6.5–8.1)

## 2021-07-09 LAB — MAGNESIUM: Magnesium: 2.3 mg/dL (ref 1.7–2.4)

## 2021-07-09 LAB — HEMOGLOBIN A1C
Hgb A1c MFr Bld: 7.2 % — ABNORMAL HIGH (ref 4.8–5.6)
Mean Plasma Glucose: 159.94 mg/dL

## 2021-07-09 LAB — HIV ANTIBODY (ROUTINE TESTING W REFLEX): HIV Screen 4th Generation wRfx: NONREACTIVE

## 2021-07-09 LAB — PHOSPHORUS: Phosphorus: 4.2 mg/dL (ref 2.5–4.6)

## 2021-07-09 LAB — OSMOLALITY, URINE: Osmolality, Ur: 310 mOsm/kg (ref 300–900)

## 2021-07-09 MED ORDER — HYDROCORTISONE 20 MG PO TABS: 10.0000 mg | ORAL_TABLET | Freq: Two times a day (BID) | ORAL | 0 refills | Status: AC

## 2021-07-09 MED ORDER — LIOTHYRONINE SODIUM 5 MCG PO TABS: 62.5000 ug | ORAL_TABLET | Freq: Two times a day (BID) | ORAL | 0 refills | Status: AC

## 2021-07-09 MED ORDER — LEVOTHYROXINE SODIUM 50 MCG PO TABS: 50.0000 ug | ORAL_TABLET | Freq: Every day | ORAL | Status: DC

## 2021-07-09 MED ORDER — LIOTHYRONINE SODIUM 25 MCG PO TABS
62.5000 ug | ORAL_TABLET | Freq: Two times a day (BID) | ORAL | Status: DC
  Administered 2021-07-09: 62.5 ug via ORAL
  Filled 2021-07-09: qty 3

## 2021-07-09 MED ORDER — LIOTHYRONINE SODIUM 25 MCG PO TABS: 125.0000 ug | ORAL_TABLET | Freq: Every day | ORAL | Status: DC

## 2021-07-09 MED ORDER — METHOCARBAMOL 500 MG PO TABS: 500.0000 mg | ORAL_TABLET | Freq: Three times a day (TID) | ORAL | 0 refills | Status: AC | PRN

## 2021-07-09 MED ORDER — HYDROCORTISONE 10 MG PO TABS: 10.0000 mg | ORAL_TABLET | Freq: Every day | ORAL | Status: DC

## 2021-07-09 MED ORDER — HYDROCORTISONE 20 MG PO TABS
20.0000 mg | ORAL_TABLET | Freq: Every morning | ORAL | Status: DC
  Administered 2021-07-09: 20 mg via ORAL
  Filled 2021-07-09: qty 1

## 2021-07-09 MED ORDER — METHOCARBAMOL 500 MG PO TABS
250.0000 mg | ORAL_TABLET | Freq: Once | ORAL | Status: AC
  Administered 2021-07-09: 250 mg via ORAL
  Filled 2021-07-09: qty 1

## 2021-07-09 NOTE — Progress Notes (Signed)
OT Cancellation Note  Patient Details Name: Tayja Manzer MRN: [id number] DOB: 08-04-1971   Cancelled Treatment:    Reason Eval/Treat Not Completed: OT screened, no needs identified, will sign off. Patient independent in room with ADLs and mobility. Reports no deficits and no therapy needs.  Trooper Olander L Vong Garringer 07/09/2021, 8:44 AM

## 2021-07-09 NOTE — Progress Notes (Addendum)
SATURATION QUALIFICATIONS: (This note is used to comply with regulatory documentation for home oxygen)  Patient Saturations on Room Air at Rest = 99%  Patient Saturations on Room Air while Ambulating = 97%  Pt has ambulated many laps in hallway independently, very steady on her feet. O2 Sats remain 97% RA and above.

## 2021-07-09 NOTE — Discharge Summary (Signed)
Physician Discharge Summary  Natalie Mills OVZ:[id number] DOB: [birth year] DOA: 07/08/2021  PCP: Jones Bales, MD  Admit date: 07/08/2021 Discharge date: 07/09/2021  Admitted From: home Disposition:  home  Recommendations for Outpatient Follow-up:  Follow up with PCP in 1-2 weeks  Home Health: none Equipment/Devices: none  Discharge Condition: stable CODE STATUS: Full code Diet recommendation: regular  HPI: Per admitting MD, Natalie Mills is a 50 y.o. female with medical history significant of thyroidectomy in 2011. chronic adrenal insufficiency  Presented with  fluid retention and confusion  She is not taking any medications  that she has tried Synthroid in the past and it does not work however Cytomel has been effective historically. Says manufactures stopped making it, she tried naturethyroid but it was recalled She has tried to change her meds to bovine thyroid OTC 6 wks ago  She ws taken cortef 40 mg for years she could not get it during covid, she switched to Prednisone, but that caused her DM so her MD told her to taper it off been not taking it for over a year. Patient states that she has numerous times failed ACTH stimulation test on 8  Hospital Course / Discharge diagnoses: Principal problem Uncontrolled hypothyroidism-patient was found to have significantly elevated TSH.  She has been off thyroid supplementation for quite some time, she tells me that she is [facility] by [facility], Synthroid has not worked for her in the past but had good results with Cytomel.  This was started during the hospital, she tolerating it well, she seems to be back to baseline and will be discharged home in stable condition.  She was advised to establish with endocrinology however she prefers to keep her care at [facility].  Active problems Adrenal insufficiency-she was restarted on hydrocortisone, for now recommend that she is discharged on 20 mg in the  morning and 10 mg in the evening, and ideally should be cut down to a physiologic dose of 10 mg in the morning and 5 mg in the evening within the next week or 2. AKI-resolved   Sepsis ruled out   Discharge Instructions   Allergies as of 07/09/2021       Reactions   Ciprofloxacin Nausea And Vomiting   Penicillins Hives   Did it involve swelling of the face/tongue/throat, SOB, or low BP? No Did it involve sudden or severe rash/hives, skin peeling, or any reaction on the inside of your mouth or nose? Yes Did you need to seek medical attention at a hospital or doctor's office? No When did it last happen?      childhood If all above answers are "NO", may proceed with cephalosporin use.        Medication List     STOP taking these medications    naproxen 500 MG tablet Commonly known as: Naprosyn   OVER THE COUNTER MEDICATION       TAKE these medications    hydrocortisone 20 MG tablet Commonly known as: CORTEF Take 0.5-1 tablets (10-20 mg total) by mouth in the morning and at bedtime. 20 mg in the morning and 10 mg in the afternoon   ibuprofen 200 MG tablet Commonly known as: ADVIL Take 800 mg by mouth every 6 (six) hours as needed for fever, headache or mild pain.   liothyronine 5 MCG tablet Commonly known as: CYTOMEL Take 12.5 tablets (62.5 mcg total) by mouth 2 (two) times daily.   methocarbamol 500 MG tablet Commonly known as: ROBAXIN Take  1 tablet (500 mg total) by mouth every 8 (eight) hours as needed for muscle spasms. What changed:  when to take this reasons to take this   progesterone 100 MG capsule Commonly known as: PROMETRIUM Take 200 mg by mouth daily. Takes on days 17-26 menstrual cycle        Follow-up Information     Jones Bales, MD Follow up in 1 week(s).   Specialty: Family Medicine Contact information: [address] [place] [address] [phone]                  Consultations: none  Procedures/Studies:  none  No results found.   Subjective: - no chest pain, shortness of breath, no abdominal pain, nausea or vomiting.   Discharge Exam: BP 115/60 (BP Location: Left Arm)   Pulse (!) 59   Temp 97.9 F (36.6 C) (Oral)   Resp 16   Ht 5\' 8"  (1.727 m)   Wt 113.3 kg   SpO2 96%   BMI 37.98 kg/m   General: Pt is alert, awake, not in acute distress Cardiovascular: RRR, S1/S2 +, no rubs, no gallops Respiratory: CTA bilaterally, no wheezing, no rhonchi Abdominal: Soft, NT, ND, bowel sounds + Extremities: trace edema, no cyanosis    The results of significant diagnostics from this hospitalization (including imaging, microbiology, ancillary and laboratory) are listed below for reference.     Microbiology: Recent Results (from the past 240 hour(s))  Resp Panel by RT-PCR (Flu A&B, Covid) Nasopharyngeal Swab     Status: None   Collection Time: 07/08/21  7:35 PM   Specimen: Nasopharyngeal Swab; Nasopharyngeal(NP) swabs in vial transport medium  Result Value Ref Range Status   SARS Coronavirus 2 by RT PCR NEGATIVE NEGATIVE Final    Comment: (NOTE) SARS-CoV-2 target nucleic acids are NOT DETECTED.  The SARS-CoV-2 RNA is generally detectable in upper respiratory specimens during the acute phase of infection. The lowest concentration of SARS-CoV-2 viral copies this assay can detect is 138 copies/mL. A negative result does not preclude SARS-Cov-2 infection and should not be used as the sole basis for treatment or other patient management decisions. A negative result may occur with  improper specimen collection/handling, submission of specimen other than nasopharyngeal swab, presence of viral mutation(s) within the areas targeted by this assay, and inadequate number of viral copies(<138 copies/mL). A negative result must be combined with clinical observations, patient history, and epidemiological information. The expected result is  Negative.  Fact Sheet for Patients:  07/10/21  Fact Sheet for Healthcare Providers:  BloggerCourse.com  This test is no t yet approved or cleared by the [employer] FDA and  has been authorized for detection and/or diagnosis of SARS-CoV-2 by FDA under an Emergency Use Authorization (EUA). This EUA will remain  in effect (meaning this test can be used) for the duration of the COVID-19 declaration under Section 564(b)(1) of the Act, 21 U.S.C.section 360bbb-3(b)(1), unless the authorization is terminated  or revoked sooner.       Influenza A by PCR NEGATIVE NEGATIVE Final   Influenza B by PCR NEGATIVE NEGATIVE Final    Comment: (NOTE) The Xpert Xpress SARS-CoV-2/FLU/RSV plus assay is intended as an aid in the diagnosis of influenza from Nasopharyngeal swab specimens and should not be used as a sole basis for treatment. Nasal washings and aspirates are unacceptable for Xpert Xpress SARS-CoV-2/FLU/RSV testing.  Fact Sheet for Patients: [place]  Fact Sheet for Healthcare Providers: BloggerCourse.com  This test is not yet  approved or cleared by the [place] and has been authorized for detection and/or diagnosis of SARS-CoV-2 by FDA under an Emergency Use Authorization (EUA). This EUA will remain in effect (meaning this test can be used) for the duration of the COVID-19 declaration under Section 564(b)(1) of the Act, 21 U.S.C. section 360bbb-3(b)(1), unless the authorization is terminated or revoked.  Performed at [facility], [address], [place], [place] [address]      Labs: Basic Metabolic Panel: Recent Labs  Lab 07/08/21 1417 07/09/21 0424  NA 136 135  K 3.6 4.3  CL 97* 99  CO2 32 27  GLUCOSE 141* 154*  BUN 13 13  CREATININE 1.11* 0.92  CALCIUM 8.4* 8.6*  MG  --  2.3  PHOS  --  4.2   Liver Function  Tests: Recent Labs  Lab 07/08/21 1417 07/09/21 0424  AST 43* 53*  ALT 34 32  ALKPHOS 67 59  BILITOT 0.7 0.9  PROT 7.6 7.0  ALBUMIN 4.2 3.7   CBC: Recent Labs  Lab 07/08/21 1417 07/09/21 0424  WBC 8.3 7.7  NEUTROABS 4.6 6.3  HGB 14.3 13.5  HCT 44.9 41.3  MCV 87.2 85.0  PLT 184 177   CBG: Recent Labs  Lab 07/09/21 0025 07/09/21 0426 07/09/21 0749  GLUCAP 178* 152* 125*   Hgb A1c Recent Labs    07/08/21 2209  HGBA1C 7.2*   Lipid Profile No results for input(s): CHOL, HDL, LDLCALC, TRIG, CHOLHDL, LDLDIRECT in the last 72 hours. Thyroid function studies Recent Labs    07/09/21 0424  TSH 21.759*   Urinalysis    Component Value Date/Time   COLORURINE YELLOW 06/21/2010 1045   APPEARANCEUR CLEAR 06/21/2010 1045   LABSPEC 1.018 06/21/2010 1045   PHURINE 6.0 06/21/2010 1045   GLUCOSEU NEGATIVE 06/21/2010 1045   HGBUR NEGATIVE 06/21/2010 1045   BILIRUBINUR NEGATIVE 06/21/2010 1045   KETONESUR NEGATIVE 06/21/2010 1045   PROTEINUR NEGATIVE 06/21/2010 1045   UROBILINOGEN 1.0 06/21/2010 1045   NITRITE NEGATIVE 06/21/2010 1045   LEUKOCYTESUR  06/21/2010 1045    NEGATIVE MICROSCOPIC NOT DONE ON URINES WITH NEGATIVE PROTEIN, BLOOD, LEUKOCYTES, NITRITE, OR GLUCOSE <1000 mg/dL.    FURTHER DISCHARGE INSTRUCTIONS:   Get Medicines reviewed and adjusted: Please take all your medications with you for your next visit with your Primary MD   Laboratory/radiological data: Please request your Primary MD to go over all hospital tests and procedure/radiological results at the follow up, please ask your Primary MD to get all Hospital records sent to his/her office.   In some cases, they will be blood work, cultures and biopsy results pending at the time of your discharge. Please request that your primary care M.D. goes through all the records of your hospital data and follows up on these results.   Also Note the following: If you experience worsening of your admission  symptoms, develop shortness of breath, life threatening emergency, suicidal or homicidal thoughts you must seek medical attention immediately by calling 911 or calling your MD immediately  if symptoms less severe.   You must read complete instructions/literature along with all the possible adverse reactions/side effects for all the Medicines you take and that have been prescribed to you. Take any new Medicines after you have completely understood and accpet all the possible adverse reactions/side effects.    Do not drive when taking Pain medications or sleeping medications (Benzodaizepines)   Do not take more than prescribed Pain, Sleep and Anxiety Medications. It is  not advisable to combine anxiety,sleep and pain medications without talking with your primary care practitioner   Special Instructions: If you have smoked or chewed Tobacco  in the last 2 yrs please stop smoking, stop any regular Alcohol  and or any Recreational drug use.   Wear Seat belts while driving.   Please note: You were cared for by a hospitalist during your hospital stay. Once you are discharged, your primary care physician will handle any further medical issues. Please note that NO REFILLS for any discharge medications will be authorized once you are discharged, as it is imperative that you return to your primary care physician (or establish a relationship with a primary care physician if you do not have one) for your post hospital discharge needs so that they can reassess your need for medications and monitor your lab values.  Time coordinating discharge: 40 minutes  SIGNED:  Pamella Pert, MD, PhD 07/09/2021, 11:47 AM

## 2021-07-09 NOTE — Plan of Care (Signed)

## 2021-07-09 NOTE — Progress Notes (Signed)
PT Cancellation Note / Screen  Patient Details Name: Natalie Mills MRN: [id number] DOB: 01-Sep-1971   Cancelled Treatment:    Reason Eval/Treat Not Completed: PT screened, no needs identified, will sign off (refer to OT note)   Kati L Payson 07/09/2021, 9:16 AM Thomasene Mohair PT, DPT Acute Rehabilitation Services Pager: [phone] Office: [phone]

## 2021-07-10 LAB — T3: T3, Total: <20 ng/dL — ABNORMAL LOW (ref 71–180)

## 2021-09-11 ENCOUNTER — Encounter (HOSPITAL_COMMUNITY): Payer: Self-pay | Admitting: Emergency Medicine

## 2021-09-11 ENCOUNTER — Emergency Department (HOSPITAL_COMMUNITY)
Admission: EM | Admit: 2021-09-11 | Discharge: 2021-09-11 | Disposition: A | Discharge status: Home / Self Care | Payer: BC Managed Care – PPO | Attending: Emergency Medicine | Admitting: Emergency Medicine

## 2021-09-11 ENCOUNTER — Emergency Department (HOSPITAL_COMMUNITY): Payer: BC Managed Care – PPO

## 2021-09-11 DIAGNOSIS — Z79899 Other long term (current) drug therapy: Secondary | ICD-10-CM | POA: Insufficient documentation

## 2021-09-11 DIAGNOSIS — U071 COVID-19: Secondary | ICD-10-CM | POA: Diagnosis not present

## 2021-09-11 DIAGNOSIS — Z28310 Unvaccinated for covid-19: Secondary | ICD-10-CM | POA: Diagnosis not present

## 2021-09-11 DIAGNOSIS — E119 Type 2 diabetes mellitus without complications: Secondary | ICD-10-CM | POA: Insufficient documentation

## 2021-09-11 DIAGNOSIS — F17210 Nicotine dependence, cigarettes, uncomplicated: Secondary | ICD-10-CM | POA: Diagnosis not present

## 2021-09-11 DIAGNOSIS — E039 Hypothyroidism, unspecified: Secondary | ICD-10-CM | POA: Insufficient documentation

## 2021-09-11 DIAGNOSIS — R509 Fever, unspecified: Secondary | ICD-10-CM | POA: Diagnosis present

## 2021-09-11 HISTORY — DX: Unspecified adrenocortical insufficiency: E27.40

## 2021-09-11 HISTORY — DX: Type 2 diabetes mellitus without complications: E11.9

## 2021-09-11 HISTORY — DX: Disorder of thyroid, unspecified: E07.9

## 2021-09-11 HISTORY — DX: Hypothyroidism, unspecified: E03.9

## 2021-09-11 LAB — CBC WITH DIFFERENTIAL/PLATELET
Abs Immature Granulocytes: 0.01 10*3/uL (ref 0.00–0.07)
Basophils Absolute: 0 10*3/uL (ref 0.0–0.1)
Basophils Relative: 0 %
Eosinophils Absolute: 0 10*3/uL (ref 0.0–0.5)
Eosinophils Relative: 0 %
HCT: 44.1 % (ref 36.0–46.0)
Hemoglobin: 13.7 g/dL (ref 12.0–15.0)
Immature Granulocytes: 0 %
Lymphocytes Relative: 42 %
Lymphs Abs: 2.1 10*3/uL (ref 0.7–4.0)
MCH: 27 pg (ref 26.0–34.0)
MCHC: 31.1 g/dL (ref 30.0–36.0)
MCV: 86.8 fL (ref 80.0–100.0)
Monocytes Absolute: 0.7 10*3/uL (ref 0.1–1.0)
Monocytes Relative: 14 %
Neutro Abs: 2.1 10*3/uL (ref 1.7–7.7)
Neutrophils Relative %: 44 %
Platelets: 154 10*3/uL (ref 150–400)
RBC: 5.08 MIL/uL (ref 3.87–5.11)
RDW: 13.2 % (ref 11.5–15.5)
WBC: 5 10*3/uL (ref 4.0–10.5)
nRBC: 0 % (ref 0.0–0.2)

## 2021-09-11 LAB — URINALYSIS, ROUTINE W REFLEX MICROSCOPIC
Bilirubin Urine: NEGATIVE
Glucose, UA: NEGATIVE mg/dL
Hgb urine dipstick: NEGATIVE
Ketones, ur: NEGATIVE mg/dL
Leukocytes,Ua: NEGATIVE
Nitrite: NEGATIVE
Protein, ur: NEGATIVE mg/dL
Specific Gravity, Urine: 1.019 (ref 1.005–1.030)
pH: 5 (ref 5.0–8.0)

## 2021-09-11 LAB — COMPREHENSIVE METABOLIC PANEL
ALT: 47 U/L — ABNORMAL HIGH (ref 0–44)
AST: 39 U/L (ref 15–41)
Albumin: 3.8 g/dL (ref 3.5–5.0)
Alkaline Phosphatase: 73 U/L (ref 38–126)
Anion gap: 7 (ref 5–15)
BUN: 11 mg/dL (ref 6–20)
CO2: 26 mmol/L (ref 22–32)
Calcium: 8.5 mg/dL — ABNORMAL LOW (ref 8.9–10.3)
Chloride: 103 mmol/L (ref 98–111)
Creatinine, Ser: 0.58 mg/dL (ref 0.44–1.00)
GFR, Estimated: >60 mL/min (ref >60)
Glucose, Bld: 140 mg/dL — ABNORMAL HIGH (ref 70–99)
Potassium: 4 mmol/L (ref 3.5–5.1)
Sodium: 136 mmol/L (ref 135–145)
Total Bilirubin: 0.5 mg/dL (ref 0.3–1.2)
Total Protein: 7.3 g/dL (ref 6.5–8.1)

## 2021-09-11 LAB — RESP PANEL BY RT-PCR (FLU A&B, COVID) ARPGX2
Influenza A by PCR: NEGATIVE
Influenza B by PCR: NEGATIVE
SARS Coronavirus 2 by RT PCR: POSITIVE — AB

## 2021-09-11 MED ORDER — PAXLOVID (300/100) 20 X 150 MG & 10 X 100MG PO TBPK: 1.0000 | ORAL_TABLET | Freq: Two times a day (BID) | ORAL | 0 refills | Status: AC

## 2021-09-11 NOTE — ED Provider Notes (Signed)
[facility]-EMERGENCY DEPT Provider Note   CSN: [id number] Arrival date & time: 09/11/21  0458     History Chief Complaint  Patient presents with   Fever   Back Pain    Hx adrenal crisis    Natalie Mills is a 50 y.o. female.  50 year old female with prior medical history as detailed below presents for evaluation.  Patient reports longstanding history of adrenal insufficiency, diabetes, and hypothyroidism.  Patient reports that 3 days prior she developed itchy throat.  She reports mild cough.  She reports a fever a day ago of 104.  She denies chest pain.  She denies abdominal pain.  She denies nausea or vomiting.  She denies change in her bowel movement.  She denies urinary symptoms.  She is on a longstanding course of hydrocortisone.  She reports that she takes 10 mg of hydrocortisone in the morning, 20 mg in the afternoon, and 10 mg in the evening.  Yesterday and the day before she took an extra 20 mg over the course of the day in order to give herself stress dosing.  The history is provided by the patient.  Fever Max temp prior to arrival:  104 Temp source:  Oral Severity:  Mild Onset quality:  Gradual Duration:  3 days Timing:  Constant Progression:  Unchanged Chronicity:  New Back Pain Associated symptoms: fever       Past Medical History:  Diagnosis Date   Adrenal insufficiency (HCC)    Diabetes mellitus, type 2 (HCC)    Hypothyroid    Thyroid disease     Patient Active Problem List   Diagnosis Date Noted   Hypothyroidism 07/08/2021   DM2 (diabetes mellitus, type 2) (HCC) 07/08/2021   Elevated BP without diagnosis of hypertension 07/08/2021   Adrenal insufficiency (HCC) 07/08/2021   AKI (acute kidney injury) (HCC) 07/08/2021    History reviewed. No pertinent surgical history.   OB History   No obstetric history on file.     Family History  Problem Relation Age of Onset   Hypertension Other    Diabetes Other     Social  History   Tobacco Use   Smoking status: Every Day    Types: Cigarettes   Smokeless tobacco: Never  Substance Use Topics   Alcohol use: No   Drug use: No    Home Medications Prior to Admission medications   Medication Sig Start Date End Date Taking? Authorizing Provider  nirmatrelvir & ritonavir (PAXLOVID, 300/100,) 20 x 150 MG & 10 x 100MG  TBPK Take 1 tablet by mouth 2 (two) times daily for 5 days. 09/11/21 09/16/21 Yes Messick12/24/22, MD  hydrocortisone (CORTEF) 20 MG tablet Take 0.5-1 tablets (10-20 mg total) by mouth in the morning and at bedtime. 20 mg in the morning and 10 mg in the afternoon 07/09/21   07/11/21, MD  ibuprofen (ADVIL,MOTRIN) 200 MG tablet Take 800 mg by mouth every 6 (six) hours as needed for fever, headache or mild pain.    [provider]  liothyronine (CYTOMEL) 5 MCG tablet Take 12.5 tablets (62.5 mcg total) by mouth 2 (two) times daily. 07/09/21   07/11/21, MD  methocarbamol (ROBAXIN) 500 MG tablet Take 1 tablet (500 mg total) by mouth every 8 (eight) hours as needed for muscle spasms. 07/09/21   07/11/21, MD  progesterone (PROMETRIUM) 100 MG capsule Take 200 mg by mouth daily. Takes on days 17-26 menstrual cycle    [provider]  Allergies    Ciprofloxacin and Penicillins  Review of Systems   Review of Systems  Constitutional:  Positive for fever.  Musculoskeletal:  Positive for back pain.  All other systems reviewed and are negative.  Physical Exam Updated Vital Signs BP 120/76 (BP Location: Right Arm)    Pulse 86    Temp 99.4 F (37.4 C) (Oral)    Resp 15    Ht 5\' 8"  (1.727 m)    Wt 113.4 kg    LMP 09/27/2013    SpO2 97%    BMI 38.01 kg/m   Physical Exam Vitals and nursing note reviewed.  Constitutional:      General: She is not in acute distress.    Appearance: Normal appearance. She is well-developed.  HENT:     Head: Normocephalic and atraumatic.  Eyes:     Conjunctiva/sclera: Conjunctivae  normal.     Pupils: Pupils are equal, round, and reactive to light.  Cardiovascular:     Rate and Rhythm: Normal rate and regular rhythm.     Heart sounds: Normal heart sounds.  Pulmonary:     Effort: Pulmonary effort is normal. No respiratory distress.     Breath sounds: Normal breath sounds.  Abdominal:     General: There is no distension.     Palpations: Abdomen is soft.     Tenderness: There is no abdominal tenderness.  Musculoskeletal:        General: No deformity. Normal range of motion.     Cervical back: Normal range of motion and neck supple.  Skin:    General: Skin is warm and dry.  Neurological:     General: No focal deficit present.     Mental Status: She is alert and oriented to person, place, and time.    ED Results / Procedures / Treatments   Labs (all labs ordered are listed, but only abnormal results are displayed) Labs Reviewed  RESP PANEL BY RT-PCR (FLU A&B, COVID) ARPGX2 - Abnormal; Notable for the following components:      Result Value   SARS Coronavirus 2 by RT PCR POSITIVE (*)    All other components within normal limits  COMPREHENSIVE METABOLIC PANEL - Abnormal; Notable for the following components:   Glucose, Bld 140 (*)    Calcium 8.5 (*)    ALT 47 (*)    All other components within normal limits  URINALYSIS, ROUTINE W REFLEX MICROSCOPIC - Abnormal; Notable for the following components:   APPearance HAZY (*)    All other components within normal limits  CBC WITH DIFFERENTIAL/PLATELET    EKG None  Radiology DG Chest 2 View  Result Date: 09/11/2021 CLINICAL DATA:  50 year old female with history of fever. EXAM: CHEST - 2 VIEW COMPARISON:  Chest x-ray 10/12/2013. FINDINGS: Lung volumes are normal. No consolidative airspace disease. No pleural effusions. No pneumothorax. No pulmonary nodule or mass noted. Pulmonary vasculature and the cardiomediastinal silhouette are within normal limits. IMPRESSION: No radiographic evidence of acute  cardiopulmonary disease. Electronically Signed   By: 10/14/2013 M.D.   On: 09/11/2021 09:08    Procedures Procedures   Medications Ordered in ED Medications - No data to display  ED Course  I have reviewed the triage vital signs and the nursing notes.  Pertinent labs & imaging results that were available during my care of the patient were reviewed by me and considered in my medical decision making (see chart for details).    MDM Rules/Calculators/A&P  MDM  MSE complete  Yurianna Tusing was evaluated in Emergency Department on 09/11/2021 for the symptoms described in the history of present illness. She was evaluated in the context of the global COVID-19 pandemic, which necessitated consideration that the patient might be at risk for infection with the SARS-CoV-2 virus that causes COVID-19. Institutional protocols and algorithms that pertain to the evaluation of patients at risk for COVID-19 are in a state of rapid change based on information released by regulatory bodies including the CDC and federal and state organizations. These policies and algorithms were followed during the patient's care in the ED.  Patient is presenting with viral URI symptoms that began 3 days prior.  Patient was concerned about possibility of adrenal insufficiency.  She does take standing doses of hydrocortisone daily.  She took an extra 20 mg on top of her daily 40 mg hydrocortisone at stress dose testing over the last 48 hours.  She denies feeling lightheaded or dizzy.  She has normal vitals here in the ED.  Chest x-ray is without evidence of pneumonia.  Screening labs obtained are significant for positive COVID swab.  Patient's glucose is 140.  Other screening labs are without significant abnormality.  Patient reports that she has never been vaccinated for COVID.  She denies prior known COVID infection.  She reports that she was advised that she should not get a COVID-vaccine given  her chronic immunosuppression.  Patient is advised that she is high risk for progression of significant illness with her COVID infection.  She appears to be a candidate for Paxlovid at this time.  She agrees with plan for Paxlovid prescription and close outpatient monitoring.  She was offered overnight admission for close observation.  She declined same.  She does understand need to return to the ED if she develops worsening shortness of breath, worsening fever, hypotension, or other concern.    CRITICAL CARE Performed by: Wynetta Fines   Total critical care time: 30 minutes  Critical care time was exclusive of separately billable procedures and treating other patients.  Critical care was necessary to treat or prevent imminent or life-threatening deterioration.  Critical care was time spent personally by me on the following activities: development of treatment plan with patient and/or surrogate as well as nursing, discussions with consultants, evaluation of patient's response to treatment, examination of patient, obtaining history from patient or surrogate, ordering and performing treatments and interventions, ordering and review of laboratory studies, ordering and review of radiographic studies, pulse oximetry and re-evaluation of patient's condition.   Final Clinical Impression(s) / ED Diagnoses Final diagnoses:  COVID    Rx / DC Orders ED Discharge Orders          Ordered    nirmatrelvir & ritonavir (PAXLOVID, 300/100,) 20 x 150 MG & 10 x 100MG  TBPK  2 times daily        09/11/21 1236             09/13/21, MD 09/11/21 1242

## 2021-09-11 NOTE — ED Triage Notes (Signed)
Patient arrives stating she is in a possible adrenal crisis. Patient states that Wednesday she got a tickle in her throat, and since then has had an increase in symptom severity. Patient states she is having low back pain, fever as high as 104, malaise, nausea and dizziness. Patient states she takes Cortef.

## 2021-09-11 NOTE — Discharge Instructions (Addendum)
Return for any problem.  If you develop significant shortness of breath, low blood pressure, significant derangement in your blood sugar, or other concern please return to the ED immediately for evaluation.

## 2023-01-08 ENCOUNTER — Encounter (HOSPITAL_BASED_OUTPATIENT_CLINIC_OR_DEPARTMENT_OTHER): Payer: Self-pay

## 2023-01-08 ENCOUNTER — Emergency Department (HOSPITAL_BASED_OUTPATIENT_CLINIC_OR_DEPARTMENT_OTHER)
Admission: EM | Admit: 2023-01-08 | Discharge: 2023-01-08 | Disposition: A | Discharge status: Home / Self Care | Payer: Managed Care, Other (non HMO) | Attending: Emergency Medicine | Admitting: Emergency Medicine

## 2023-01-08 ENCOUNTER — Emergency Department (HOSPITAL_BASED_OUTPATIENT_CLINIC_OR_DEPARTMENT_OTHER): Payer: Managed Care, Other (non HMO)

## 2023-01-08 DIAGNOSIS — S80812A Abrasion, left lower leg, initial encounter: Secondary | ICD-10-CM | POA: Insufficient documentation

## 2023-01-08 DIAGNOSIS — T148XXA Other injury of unspecified body region, initial encounter: Secondary | ICD-10-CM

## 2023-01-08 DIAGNOSIS — W19XXXA Unspecified fall, initial encounter: Secondary | ICD-10-CM | POA: Diagnosis not present

## 2023-01-08 DIAGNOSIS — M25572 Pain in left ankle and joints of left foot: Secondary | ICD-10-CM | POA: Diagnosis not present

## 2023-01-08 DIAGNOSIS — M79671 Pain in right foot: Secondary | ICD-10-CM | POA: Diagnosis not present

## 2023-01-08 DIAGNOSIS — Z23 Encounter for immunization: Secondary | ICD-10-CM | POA: Diagnosis not present

## 2023-01-08 DIAGNOSIS — M25551 Pain in right hip: Secondary | ICD-10-CM | POA: Insufficient documentation

## 2023-01-08 DIAGNOSIS — S8992XA Unspecified injury of left lower leg, initial encounter: Secondary | ICD-10-CM | POA: Diagnosis present

## 2023-01-08 MED ORDER — TETANUS-DIPHTH-ACELL PERTUSSIS 5-2.5-18.5 LF-MCG/0.5 IM SUSY
0.5000 mL | PREFILLED_SYRINGE | Freq: Once | INTRAMUSCULAR | Status: AC
  Administered 2023-01-08: 0.5 mL via INTRAMUSCULAR
  Filled 2023-01-08: qty 0.5

## 2023-01-08 NOTE — ED Triage Notes (Signed)
Pt ambulatory to triage Pt reports falling due to shoelace getting hung up on crowbar.  Pt reports right and left foot, ankle, shin, and right hip pain. Abraised area noted to left shin

## 2023-01-08 NOTE — Discharge Instructions (Addendum)
Imaging did not show any fractures however you do have heel spurs to bilateral feet.  Keep a close eye on your wound, warm soapy water run over it.  Tylenol or Motrin as needed for pain.  Return for worsening symptoms

## 2023-01-08 NOTE — ED Provider Notes (Signed)
[place] [facility] Provider Note   CSN: [id number] Arrival date & time: 01/08/23  1507     History  Fall   Natalie Mills is a 52 y.o. female past medical history here for evaluation of fall.  Mechanical fall PTA.  Denies hitting head, LOC or anticoagulation.  Has abrasion to left tib-fib, right foot pain, left ankle pain, right hip pain.  Ambulatory PTA.  No new midline C/T/L tenderness.  No numbness or weakness.  Took ibuprofen PTA  Tetanus not up to date  HPI     Home Medications Prior to Admission medications   Medication Sig Start Date End Date Taking? Authorizing Provider  hydrocortisone (CORTEF) 20 MG tablet Take 0.5-1 tablets (10-20 mg total) by mouth in the morning and at bedtime. 20 mg in the morning and 10 mg in the afternoon 07/09/21   Leatha Gilding, MD  ibuprofen (ADVIL,MOTRIN) 200 MG tablet Take 800 mg by mouth every 6 (six) hours as needed for fever, headache or mild pain.    [provider]  liothyronine (CYTOMEL) 5 MCG tablet Take 12.5 tablets (62.5 mcg total) by mouth 2 (two) times daily. 07/09/21   Leatha Gilding, MD  methocarbamol (ROBAXIN) 500 MG tablet Take 1 tablet (500 mg total) by mouth every 8 (eight) hours as needed for muscle spasms. 07/09/21   Leatha Gilding, MD  progesterone (PROMETRIUM) 100 MG capsule Take 200 mg by mouth daily. Takes on days 17-26 menstrual cycle    [provider]      Allergies    Ciprofloxacin and Penicillins    Review of Systems   Review of Systems  Constitutional: Negative.   HENT: Negative.    Respiratory: Negative.    Cardiovascular: Negative.   Gastrointestinal: Negative.   Genitourinary: Negative.   Musculoskeletal:        Bilateral lower extremity pain  Skin:        Abrasion left tib/fib  Neurological: Negative.   All other systems reviewed and are negative.   Physical Exam Updated Vital Signs BP 131/66 (BP Location: Left Arm)   Pulse 90    Temp 98.1 F (36.7 C) (Oral)   Resp 18   Ht  (1.727 m)   Wt 113.4 kg   LMP 09/27/2013   SpO2 95%   BMI 38.01 kg/m  Physical Exam Vitals and nursing note reviewed.  Constitutional:      General: She is not in acute distress.    Appearance: She is well-developed. She is not ill-appearing.  HENT:     Head: Atraumatic.  Eyes:     Pupils: Pupils are equal, round, and reactive to light.  Cardiovascular:     Rate and Rhythm: Normal rate.  Pulmonary:     Effort: No respiratory distress.  Abdominal:     General: There is no distension.  Musculoskeletal:        General: Normal range of motion.     Cervical back: Normal range of motion.     Comments: No midline C/T/L tenderness.  Lifts arms above shoulder.  Ambulatory.  Mild tenderness to right hip however full range of motion.  Tenderness right foot, left ankle, left tib-fib.  Nontender bilateral femur.  Pelvis stable  Skin:    General: Skin is warm and dry.     Capillary Refill: Capillary refill takes less than 2 seconds.     Comments: Abrasion left tib-fib  Neurological:     General: No focal  deficit present.     Mental Status: She is alert.  Psychiatric:        Mood and Affect: Mood normal.     ED Results / Procedures / Treatments   Labs (all labs ordered are listed, but only abnormal results are displayed) Labs Reviewed - No data to display  EKG None  Radiology DG Tibia/Fibula Left  Result Date: 01/08/2023 CLINICAL DATA:  Fall prior to arrival. Bilateral lower extremity pain. EXAM: LEFT TIBIA AND FIBULA - 2 VIEW COMPARISON:  None Available. FINDINGS: There is no evidence of fracture or other focal bone lesions. Cortical margins of the tibia and fibula are intact. Knee alignment is maintained. There may be slight soft tissue edema laterally. IMPRESSION: 1. No fracture or dislocation. 2. Possible mild soft tissue edema laterally. Electronically Signed   By: Narda Rutherford M.D.   On: 01/08/2023 16:20   DG Foot  Complete Right  Result Date: 01/08/2023 CLINICAL DATA:  Fall prior to arrival. Bilateral lower extremity pain. EXAM: RIGHT FOOT COMPLETE - 3+ VIEW COMPARISON:  None Available. FINDINGS: There is no evidence of fracture or dislocation. Moderate plantar calcaneal spur. There is no evidence of arthropathy or other focal bone abnormality. Soft tissues are unremarkable. IMPRESSION: 1. No acute fracture or dislocation. 2. Moderate plantar calcaneal spur. Electronically Signed   By: Narda Rutherford M.D.   On: 01/08/2023 16:19   DG Foot Complete Left  Result Date: 01/08/2023 CLINICAL DATA:  Fall prior to arrival. Bilateral lower extremity pain. EXAM: LEFT FOOT - COMPLETE 3+ VIEW COMPARISON:  None Available. FINDINGS: There is no evidence of fracture or dislocation. Minor degenerative change of the first metatarsal phalangeal joint as well as dorsal spurring in the midfoot. Incidental bone island in the distal phalanx. Soft tissues are unremarkable. IMPRESSION: No acute fracture or subluxation of the left foot. Electronically Signed   By: Narda Rutherford M.D.   On: 01/08/2023 16:18   DG Ankle Complete Left  Result Date: 01/08/2023 CLINICAL DATA:  Fall prior to arrival. Bilateral lower extremity pain. EXAM: LEFT ANKLE COMPLETE - 3+ VIEW COMPARISON:  None Available. FINDINGS: There is no evidence of fracture, dislocation, or joint effusion. The ankle mortise is preserved. There is a moderate-large plantar calcaneal spur. Soft tissues are unremarkable. IMPRESSION: 1. No acute fracture or dislocation. 2. Moderate-large plantar calcaneal spur. Electronically Signed   By: Narda Rutherford M.D.   On: 01/08/2023 16:18   DG Hip Unilat W or Wo Pelvis 2-3 Views Right  Result Date: 01/08/2023 CLINICAL DATA:  Fall prior to arrival. Bilateral lower extremity pain. EXAM: DG HIP (WITH OR WITHOUT PELVIS) 2-3V RIGHT COMPARISON:  None Available. FINDINGS: The cortical margins of the bony pelvis and both hips are intact. No  fracture. Pubic symphysis and sacroiliac joints are congruent. Both femoral heads are well-seated in the respective acetabula. IMPRESSION: No fracture or dislocation of the pelvis or hips. Electronically Signed   By: Narda Rutherford M.D.   On: 01/08/2023 16:17    Procedures Procedures    Medications Ordered in ED Medications  Tdap (BOOSTRIX) injection 0.5 mL (has no administration in time range)    ED Course/ Medical Decision Making/ A&P   52 year old here for evaluation after mechanical fall which occurred PTA.  Denies hitting head, LOC anticoagulation.  Has been ambulatory prior to arrival.  Has abrasion to left tib-fib.  Has some bilateral lower extremity pain however is neurovascularly intact.  No midline C/T/L tenderness.  Ibuprofen PTA.  Will plan on  imaging, reassess. Tetanus updates due to open wound  Imaging personally viewed and interpreted:  No acute fracture, dislocation. does show bilateral heel spurs   Patient reassessed.  Discussed imaging.  Discharged home with symptomatic management.  Will return for new or worsening symptoms.  The patient has been appropriately medically screened and/or stabilized in the ED. I have low suspicion for any other emergent medical condition which would require further screening, evaluation or treatment in the ED or require inpatient management.  Patient is hemodynamically stable and in no acute distress.  Patient able to ambulate in department prior to ED.  Evaluation does not show acute pathology that would require ongoing or additional emergent interventions while in the emergency department or further inpatient treatment.  I have discussed the diagnosis with the patient and answered all questions.  Pain is been managed while in the emergency department and patient has no further complaints prior to discharge.  Patient is comfortable with plan discussed in room and is stable for discharge at this time.  I have discussed strict return  precautions for returning to the emergency department.  Patient was encouraged to follow-up with PCP/specialist refer to at discharge.                              Medical Decision Making Amount and/or Complexity of Data Reviewed Independent Historian: friend External Data Reviewed: radiology and notes. Radiology: ordered and independent interpretation performed. Decision-making details documented in ED Course.  Risk OTC drugs. Prescription drug management. Decision regarding hospitalization. Diagnosis or treatment significantly limited by social determinants of health.         Final Clinical Impression(s) / ED Diagnoses Final diagnoses:  Fall, initial encounter  Abrasion    Rx / DC Orders ED Discharge Orders     None         Glyn Zendejas A, PA-C 01/08/23 1632    Alvira Monday, MD 01/08/23 2119

## 2023-01-08 NOTE — ED Notes (Signed)
Wound cleaned and dressed.

## 2023-11-12 IMAGING — CR DG CHEST 2V
2 series · 2 of 2 positions shown · non-contrast
Comparison: Chest x-ray 10/12/2013.

CLINICAL DATA: 50-year-old female with history of fever.

EXAM:
CHEST - 2 VIEW

[w chest pa]
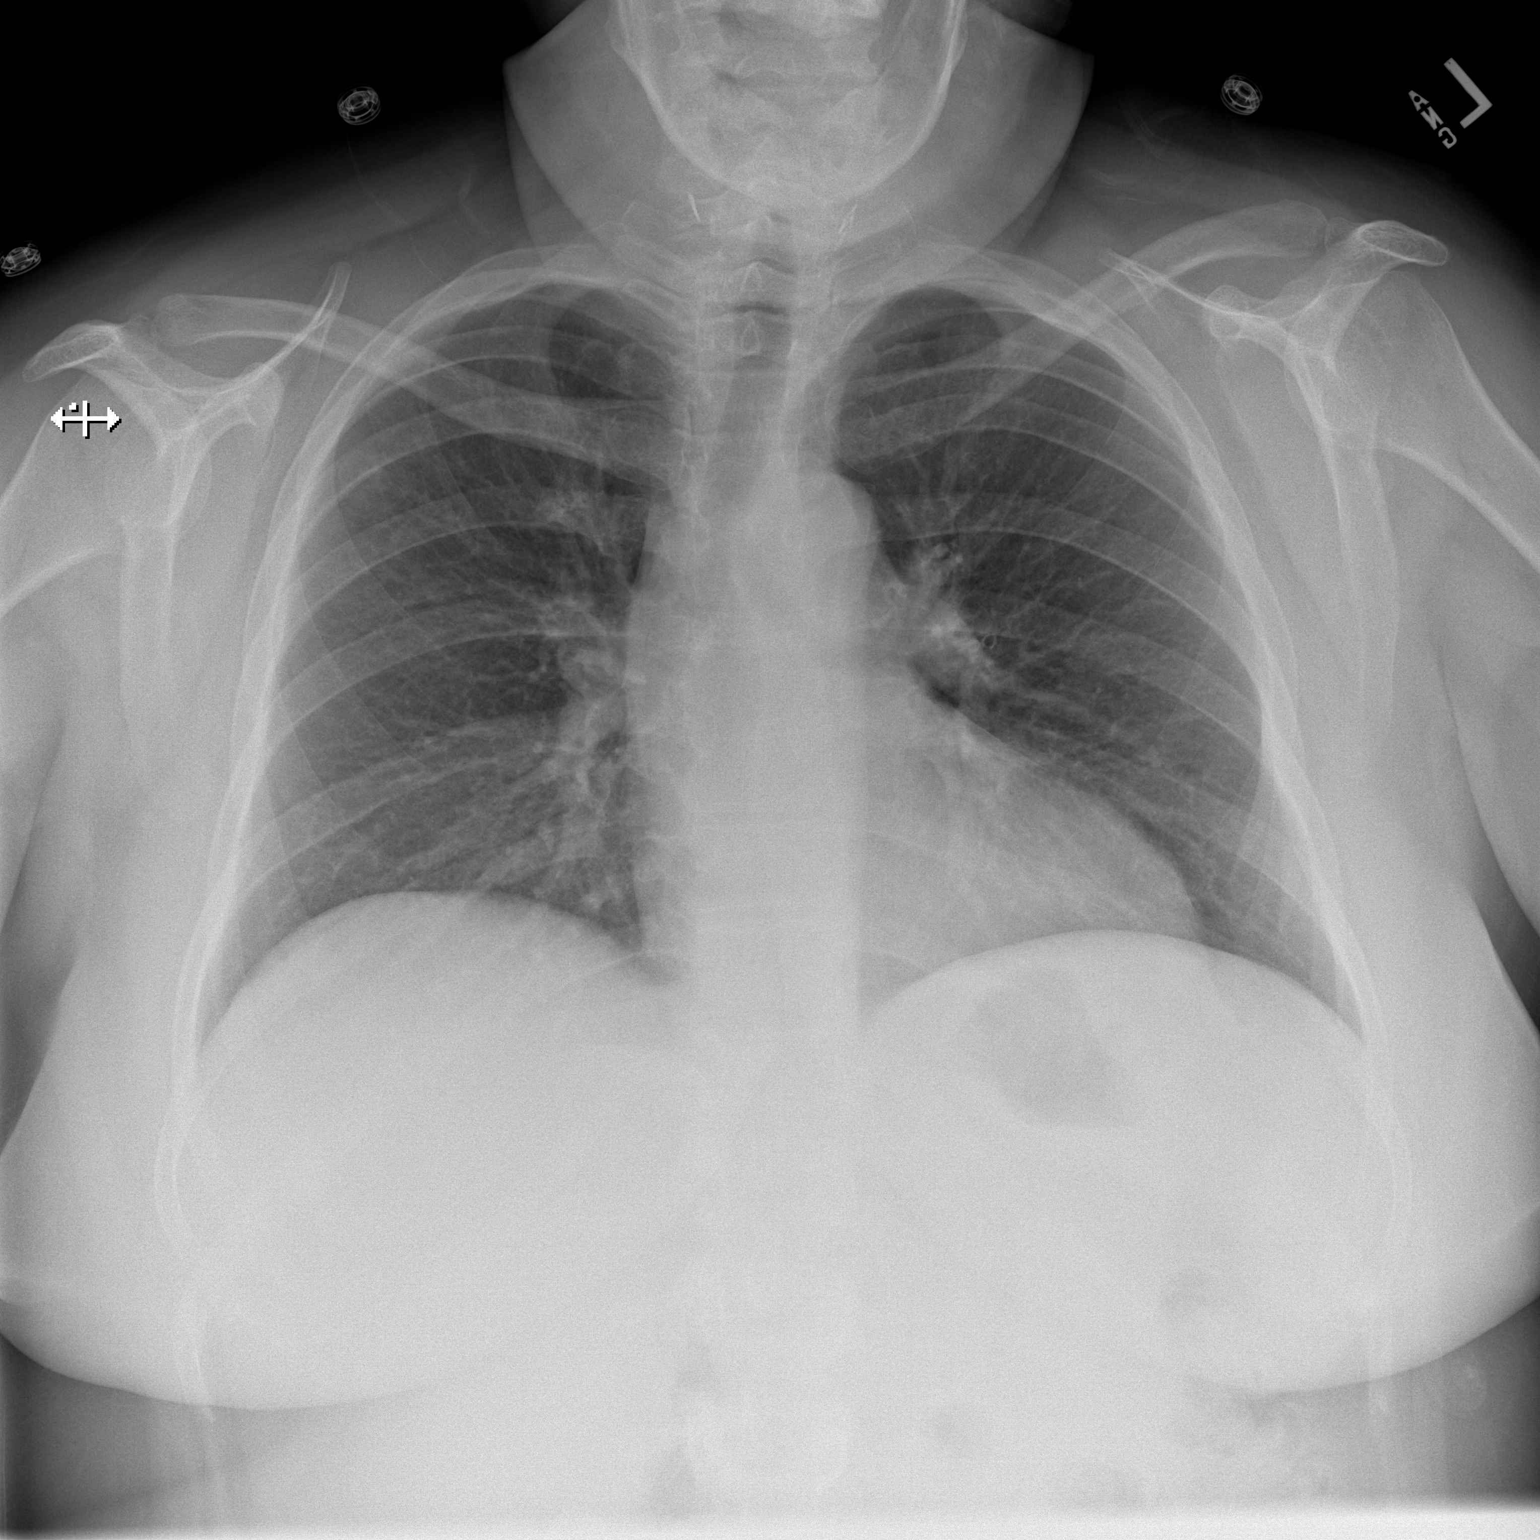

[w chest lat]
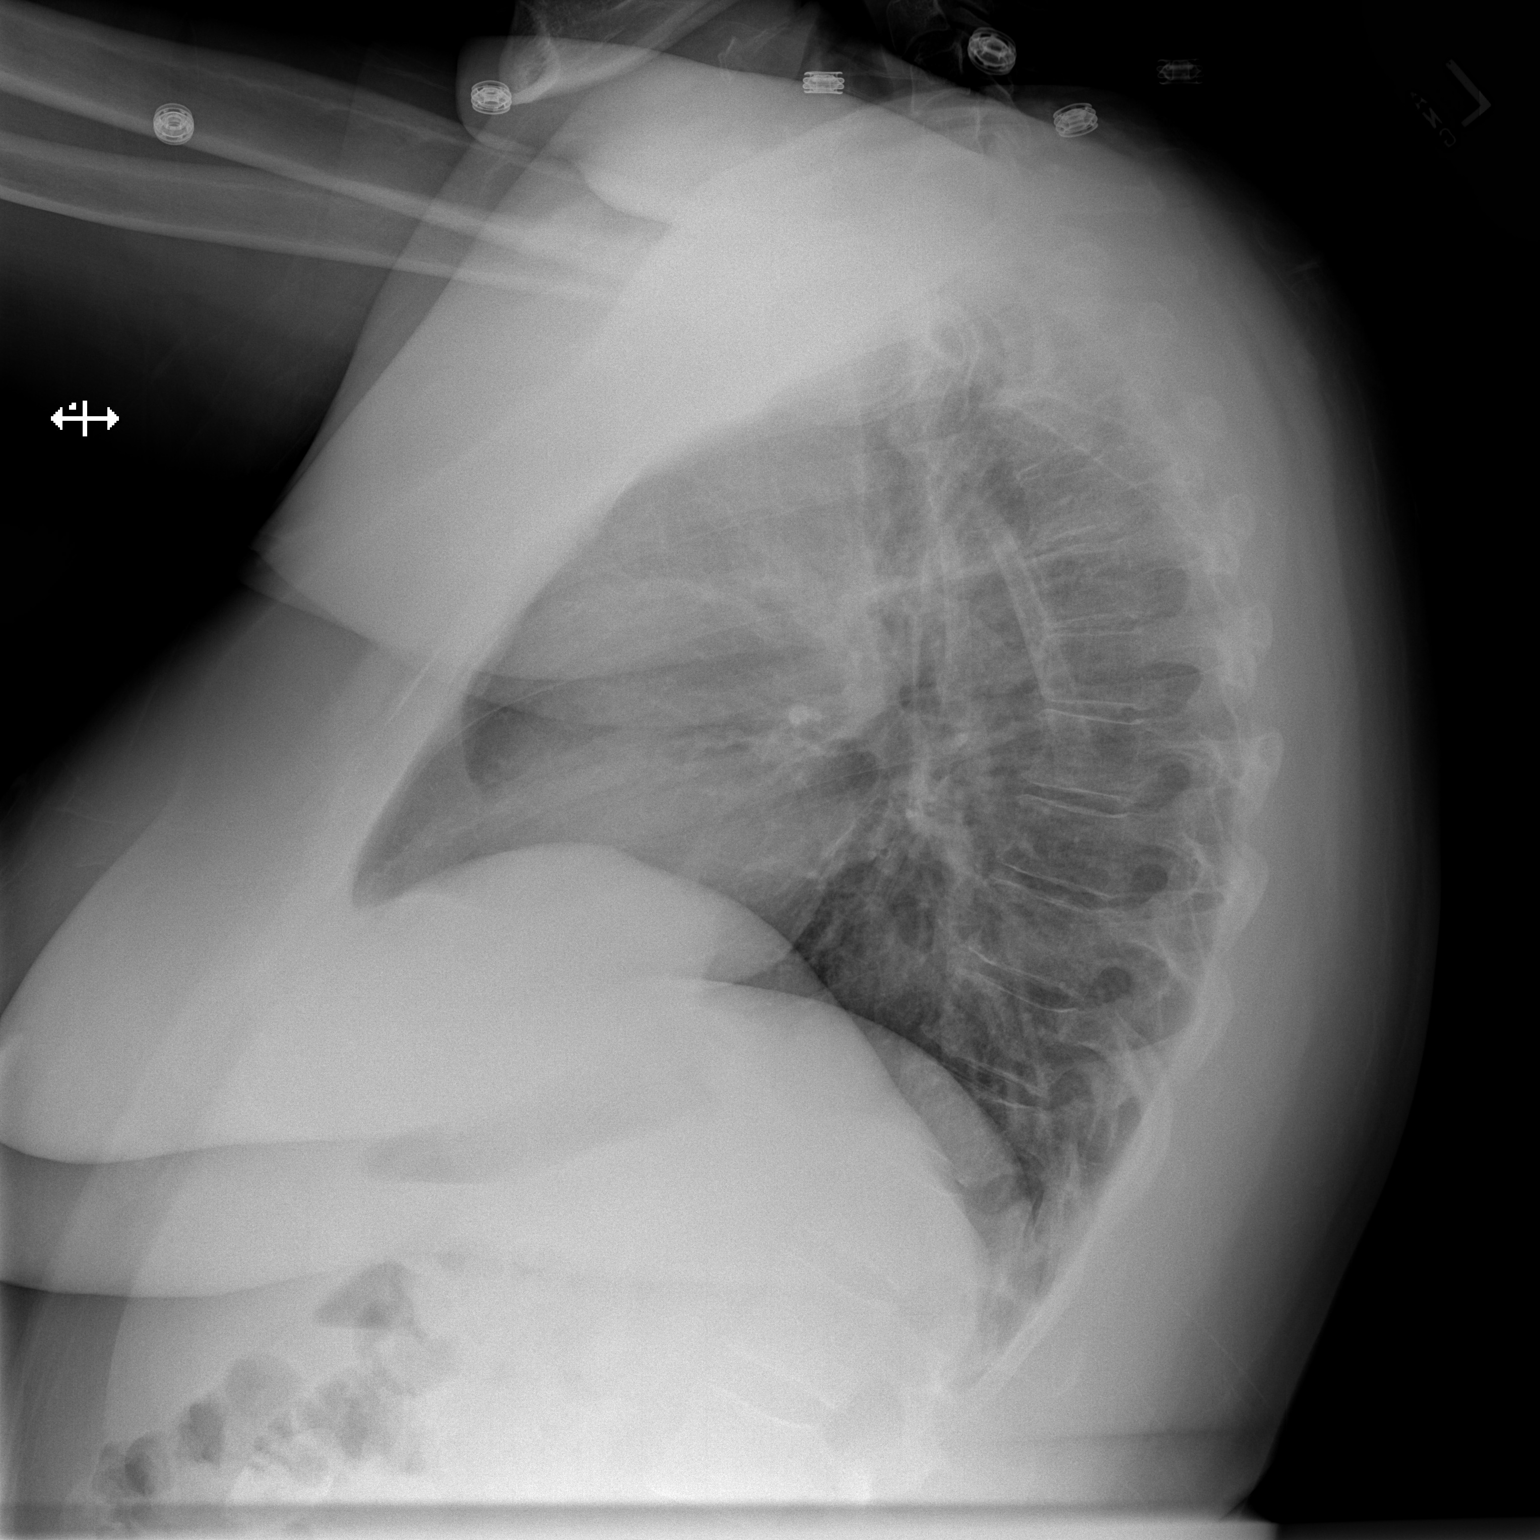

[2 of 2 positions shown; findings below may reference images not displayed]

FINDINGS: Lung volumes are normal. No consolidative airspace disease. No
pleural effusions. No pneumothorax. No pulmonary nodule or mass
noted. Pulmonary vasculature and the cardiomediastinal silhouette
are within normal limits.
IMPRESSION: No radiographic evidence of acute cardiopulmonary disease.
